# Patient Record
Sex: Female | Born: 1960
Health system: Southern US, Community
[De-identification: ages and names within clinical notes are randomized; demographics above are authoritative.]

## PROBLEM LIST (undated history)

## (undated) DIAGNOSIS — E079 Disorder of thyroid, unspecified: Secondary | ICD-10-CM

## (undated) DIAGNOSIS — K219 Gastro-esophageal reflux disease without esophagitis: Secondary | ICD-10-CM

## (undated) DIAGNOSIS — D649 Anemia, unspecified: Secondary | ICD-10-CM

## (undated) HISTORY — DX: Gastro-esophageal reflux disease without esophagitis: K21.9

## (undated) HISTORY — PX: CHOLECYSTECTOMY: SHX55

## (undated) HISTORY — DX: Disorder of thyroid, unspecified: E07.9

## (undated) HISTORY — DX: Anemia, unspecified: D64.9

---

## 1988-02-27 DIAGNOSIS — I341 Nonrheumatic mitral (valve) prolapse: Secondary | ICD-10-CM

## 1988-02-27 HISTORY — DX: Nonrheumatic mitral (valve) prolapse: I34.1

## 2001-05-05 ENCOUNTER — Encounter: Payer: Self-pay | Admitting: Family Medicine

## 2001-05-07 ENCOUNTER — Encounter: Payer: Self-pay | Admitting: Family Medicine

## 2003-10-27 ENCOUNTER — Encounter: Payer: Self-pay | Admitting: Family Medicine

## 2005-05-09 ENCOUNTER — Encounter: Payer: Self-pay | Admitting: Family Medicine

## 2006-02-13 ENCOUNTER — Encounter: Payer: Self-pay | Admitting: Family Medicine

## 2007-09-23 ENCOUNTER — Encounter: Payer: Self-pay | Admitting: Family Medicine

## 2007-11-13 ENCOUNTER — Encounter: Payer: Self-pay | Admitting: Family Medicine

## 2008-03-05 ENCOUNTER — Encounter: Payer: Self-pay | Admitting: Family Medicine

## 2008-05-07 ENCOUNTER — Encounter: Payer: Self-pay | Admitting: Family Medicine

## 2008-12-10 ENCOUNTER — Encounter: Payer: Self-pay | Admitting: Family Medicine

## 2009-04-22 ENCOUNTER — Encounter: Payer: Self-pay | Admitting: Family Medicine

## 2017-05-31 ENCOUNTER — Encounter: Payer: Self-pay | Admitting: Family Medicine

## 2018-09-05 ENCOUNTER — Encounter: Payer: Self-pay | Admitting: Family Medicine

## 2018-09-05 DIAGNOSIS — Z131 Encounter for screening for diabetes mellitus: Secondary | ICD-10-CM | POA: Diagnosis not present

## 2018-09-05 DIAGNOSIS — E7800 Pure hypercholesterolemia, unspecified: Secondary | ICD-10-CM | POA: Diagnosis not present

## 2018-09-05 DIAGNOSIS — E039 Hypothyroidism, unspecified: Secondary | ICD-10-CM | POA: Diagnosis not present

## 2018-09-12 DIAGNOSIS — Z6830 Body mass index (BMI) 30.0-30.9, adult: Secondary | ICD-10-CM | POA: Diagnosis not present

## 2018-09-12 DIAGNOSIS — R002 Palpitations: Secondary | ICD-10-CM | POA: Diagnosis not present

## 2018-09-12 DIAGNOSIS — R05 Cough: Secondary | ICD-10-CM | POA: Diagnosis not present

## 2018-09-12 DIAGNOSIS — R0989 Other specified symptoms and signs involving the circulatory and respiratory systems: Secondary | ICD-10-CM | POA: Diagnosis not present

## 2018-09-12 DIAGNOSIS — J45909 Unspecified asthma, uncomplicated: Secondary | ICD-10-CM | POA: Diagnosis not present

## 2018-09-12 DIAGNOSIS — Z0000 Encounter for general adult medical examination without abnormal findings: Secondary | ICD-10-CM | POA: Diagnosis not present

## 2018-09-12 DIAGNOSIS — E039 Hypothyroidism, unspecified: Secondary | ICD-10-CM | POA: Diagnosis not present

## 2018-09-12 DIAGNOSIS — R0602 Shortness of breath: Secondary | ICD-10-CM | POA: Diagnosis not present

## 2018-10-02 DIAGNOSIS — J301 Allergic rhinitis due to pollen: Secondary | ICD-10-CM | POA: Diagnosis not present

## 2018-10-28 ENCOUNTER — Encounter: Payer: Self-pay | Admitting: Family Medicine

## 2018-10-28 DIAGNOSIS — R0989 Other specified symptoms and signs involving the circulatory and respiratory systems: Secondary | ICD-10-CM | POA: Diagnosis not present

## 2018-10-28 DIAGNOSIS — J4520 Mild intermittent asthma, uncomplicated: Secondary | ICD-10-CM | POA: Diagnosis not present

## 2018-10-28 DIAGNOSIS — R05 Cough: Secondary | ICD-10-CM | POA: Diagnosis not present

## 2018-10-28 DIAGNOSIS — R911 Solitary pulmonary nodule: Secondary | ICD-10-CM | POA: Diagnosis not present

## 2018-10-31 DIAGNOSIS — R05 Cough: Secondary | ICD-10-CM | POA: Diagnosis not present

## 2018-10-31 DIAGNOSIS — R0989 Other specified symptoms and signs involving the circulatory and respiratory systems: Secondary | ICD-10-CM | POA: Diagnosis not present

## 2018-10-31 DIAGNOSIS — R911 Solitary pulmonary nodule: Secondary | ICD-10-CM | POA: Diagnosis not present

## 2018-12-03 DIAGNOSIS — R918 Other nonspecific abnormal finding of lung field: Secondary | ICD-10-CM | POA: Diagnosis not present

## 2018-12-03 DIAGNOSIS — R05 Cough: Secondary | ICD-10-CM | POA: Diagnosis not present

## 2018-12-19 DIAGNOSIS — Z20828 Contact with and (suspected) exposure to other viral communicable diseases: Secondary | ICD-10-CM | POA: Diagnosis not present

## 2019-01-06 DIAGNOSIS — Z1231 Encounter for screening mammogram for malignant neoplasm of breast: Secondary | ICD-10-CM | POA: Diagnosis not present

## 2019-01-06 DIAGNOSIS — Z01419 Encounter for gynecological examination (general) (routine) without abnormal findings: Secondary | ICD-10-CM | POA: Diagnosis not present

## 2019-01-06 DIAGNOSIS — Z1151 Encounter for screening for human papillomavirus (HPV): Secondary | ICD-10-CM | POA: Diagnosis not present

## 2019-01-07 DIAGNOSIS — Z01419 Encounter for gynecological examination (general) (routine) without abnormal findings: Secondary | ICD-10-CM | POA: Diagnosis not present

## 2019-01-26 DIAGNOSIS — A084 Viral intestinal infection, unspecified: Secondary | ICD-10-CM | POA: Diagnosis not present

## 2019-01-26 DIAGNOSIS — K589 Irritable bowel syndrome without diarrhea: Secondary | ICD-10-CM | POA: Diagnosis not present

## 2019-02-14 DIAGNOSIS — Z13820 Encounter for screening for osteoporosis: Secondary | ICD-10-CM | POA: Diagnosis not present

## 2019-02-14 DIAGNOSIS — M810 Age-related osteoporosis without current pathological fracture: Secondary | ICD-10-CM | POA: Diagnosis not present

## 2019-02-14 DIAGNOSIS — M85852 Other specified disorders of bone density and structure, left thigh: Secondary | ICD-10-CM | POA: Diagnosis not present

## 2019-02-19 DIAGNOSIS — R928 Other abnormal and inconclusive findings on diagnostic imaging of breast: Secondary | ICD-10-CM | POA: Diagnosis not present

## 2019-02-19 DIAGNOSIS — R921 Mammographic calcification found on diagnostic imaging of breast: Secondary | ICD-10-CM | POA: Diagnosis not present

## 2019-02-26 DIAGNOSIS — R921 Mammographic calcification found on diagnostic imaging of breast: Secondary | ICD-10-CM | POA: Diagnosis not present

## 2019-03-02 ENCOUNTER — Encounter: Payer: Self-pay | Admitting: Family Medicine

## 2019-03-02 DIAGNOSIS — E7800 Pure hypercholesterolemia, unspecified: Secondary | ICD-10-CM | POA: Diagnosis not present

## 2019-03-02 DIAGNOSIS — E039 Hypothyroidism, unspecified: Secondary | ICD-10-CM | POA: Diagnosis not present

## 2019-03-09 DIAGNOSIS — J301 Allergic rhinitis due to pollen: Secondary | ICD-10-CM | POA: Diagnosis not present

## 2019-03-09 DIAGNOSIS — E039 Hypothyroidism, unspecified: Secondary | ICD-10-CM | POA: Diagnosis not present

## 2019-04-03 DIAGNOSIS — R05 Cough: Secondary | ICD-10-CM | POA: Diagnosis not present

## 2019-04-03 DIAGNOSIS — J0100 Acute maxillary sinusitis, unspecified: Secondary | ICD-10-CM | POA: Diagnosis not present

## 2019-04-03 DIAGNOSIS — J209 Acute bronchitis, unspecified: Secondary | ICD-10-CM | POA: Diagnosis not present

## 2019-04-03 DIAGNOSIS — Z6830 Body mass index (BMI) 30.0-30.9, adult: Secondary | ICD-10-CM | POA: Diagnosis not present

## 2019-04-28 ENCOUNTER — Other Ambulatory Visit: Payer: Self-pay

## 2019-04-29 ENCOUNTER — Other Ambulatory Visit: Payer: Self-pay

## 2019-04-30 ENCOUNTER — Encounter: Payer: Self-pay | Admitting: Family Medicine

## 2019-04-30 ENCOUNTER — Ambulatory Visit (INDEPENDENT_AMBULATORY_CARE_PROVIDER_SITE_OTHER): Payer: BC Managed Care – PPO | Admitting: Family Medicine

## 2019-04-30 ENCOUNTER — Other Ambulatory Visit: Payer: Self-pay

## 2019-04-30 VITALS — BP 115/73 | HR 87 | Temp 98.1°F | Ht 62.0 in | Wt 163.0 lb

## 2019-04-30 DIAGNOSIS — M8580 Other specified disorders of bone density and structure, unspecified site: Secondary | ICD-10-CM

## 2019-04-30 DIAGNOSIS — Z87898 Personal history of other specified conditions: Secondary | ICD-10-CM

## 2019-04-30 DIAGNOSIS — K219 Gastro-esophageal reflux disease without esophagitis: Secondary | ICD-10-CM

## 2019-04-30 DIAGNOSIS — E034 Atrophy of thyroid (acquired): Secondary | ICD-10-CM | POA: Diagnosis not present

## 2019-04-30 DIAGNOSIS — J3089 Other allergic rhinitis: Secondary | ICD-10-CM

## 2019-04-30 DIAGNOSIS — R002 Palpitations: Secondary | ICD-10-CM

## 2019-04-30 DIAGNOSIS — Z7689 Persons encountering health services in other specified circumstances: Secondary | ICD-10-CM

## 2019-04-30 NOTE — Progress Notes (Signed)
Subjective: WU:[id number] care, heart palpitations, hypothyroidism, GERD HPI: Jodi Rios is a 59 y.o. female presenting to clinic today for:  1.  Heart palpitations Patient reports history of heart palpitations.  Symptoms are well controlled with verapamil.  No chest pain, shortness of breath.  2.  Hypothyroidism Patient reports diagnosed in 2020.  No history of radiation or surgery to the neck.  Family history significant for thyroid disorder in her mother.  Patient denies any recent heart palpitations, unplanned weight change, change in voice, difficulty swallowing, change in bowel habits.  3.  GERD Patient reports good control of acid reflux with omeprazole 20 mg daily.  Denies any nausea, vomiting, hematochezia or melena.  4.  Sinus/allergic rhinitis Patient reports allergy mediated asthma.  She has albuterol inhaler on hand if needed.  She does use Claritin daily but feels that her sinuses will be full and clogged sometimes.  She was treated for a sinus infection with antibiotics and steroids last winter.  She is not entirely sure that everything has dropped down from the frontal sinuses and drained out.  She reports varying compliance with Flonase.  She wants me to check this out today to make sure there is no evidence of ongoing infection.  Additionally, she does work in a Estate agent.  She uses appropriate protective gear.  Past Medical History:  Diagnosis Date  . Anemia   . GERD (gastroesophageal reflux disease)   . Mitral valve prolapse 1990  . Thyroid disease    Past Surgical History:  Procedure Laterality Date  . CESAREAN SECTION    . CHOLECYSTECTOMY     Social History   Socioeconomic History  . Marital status: Divorced    Spouse name: Not on file  . Number of children: 2  . Years of education: Not on file  . Highest education level: Not on file  Occupational History  . Occupation: AR15 Midwife: [employer]    Comment: 2nd  shift  Tobacco Use  . Smoking status: Never Smoker  . Smokeless tobacco: Current User  Substance and Sexual Activity  . Alcohol use: Not Currently  . Drug use: Never  . Sexual activity: Not Currently  Other Topics Concern  . Not on file  Social History Narrative   Patient is divorced.  She has 2 sons.  One son resides with her.  She has 3 grandchildren and 2 step grandchildren.   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   Social Determinants of Health   Financial Resource Strain:   . Difficulty of Paying Living Expenses: Not on file  Food Insecurity:   . Worried About Programme researcher, broadcasting/film/video in the Last Year: Not on file  . Ran Out of Food in the Last Year: Not on file  Transportation Needs:   . Lack of Transportation (Medical): Not on file  . Lack of Transportation (Non-Medical): Not on file  Physical Activity:   . Days of Exercise per Week: Not on file  . Minutes of Exercise per Session: Not on file  Stress:   . Feeling of Stress : Not on file  Social Connections:   . Frequency of Communication with Friends and Family: Not on file  . Frequency of Social Gatherings with Friends and Family: Not on file  . Attends Religious Services: Not on file  . Active Member of Clubs or Organizations: Not on file  . Attends Banker Meetings: Not  on file  . Marital Status: Not on file  Intimate Partner Violence:   . Fear of Current or Ex-Partner: Not on file  . Emotionally Abused: Not on file  . Physically Abused: Not on file  . Sexually Abused: Not on file   Current Meds  Medication Sig  . albuterol (VENTOLIN HFA) 108 (90 Base) MCG/ACT inhaler Inhale into the lungs every 6 (six) hours as needed for wheezing or shortness of breath.  . Cholecalciferol (VITAMIN D3) 10 MCG (400 UNIT) CHEW Chew by mouth.  . fluticasone (FLONASE) 50 MCG/ACT nasal spray Place into both nostrils daily.  Marland Kitchen levothyroxine (SYNTHROID) 25 MCG tablet Take 25 mcg by mouth daily.  Marland Kitchen  loratadine (CLARITIN) 10 MG tablet Take by mouth.  . Multiple Vitamin (MULTI-VITAMIN) tablet Take by mouth.  Marland Kitchen omeprazole (PRILOSEC) 20 MG capsule Take 20 mg by mouth daily.  . verapamil (CALAN) 80 MG tablet Take 1 tablet (80 mg total) by mouth daily. (please contact the office is this rx differs from her previous. She was unsure the dose)  . [DISCONTINUED] verapamil (CALAN) 80 MG tablet    Family History  Problem Relation Age of Onset  . Stroke Mother   . Thyroid disease Mother   . Hyperlipidemia Father   . Diabetes Son   . Cervical cancer Sister   . Multiple sclerosis Sister    Allergies  Allergen Reactions  . Codeine Other (See Comments)  . Nitrofurantoin Other (See Comments)  . Ofloxacin Other (See Comments)  . Penicillins Other (See Comments)  . Sulfamethoxazole Other (See Comments)     Health Maintenance: Pap, mammo, DEXA done w/ OB, ROI completed. She does have history of abnormal mammo but biopsy was negative.  Was told bone density was low and needed medication.  ROS: Per HPI  Objective: Office vital signs reviewed. BP 115/73   Pulse 87   Temp 98.1 F (36.7 C) (Temporal)   Ht 5\' 2"  (1.575 m)   Wt 163 lb (73.9 kg)   SpO2 98%   BMI 29.81 kg/m   Physical Examination:  General: Awake, alert, well nourished, No acute distress HEENT: Normal; sclera white.  No palpable thyroid nodules.  No goiter.  No exophthalmos; she has slight deviated septum.  No gross drainage appreciated on exam.  She has slight erythema and edema of the nasal turbinates. Cardio: regular rate and rhythm, S1S2 heard, no murmurs appreciated Pulm: clear to auscultation bilaterally, no wheezes, rhonchi or rales; normal work of breathing on room air Extremities: warm, well perfused, No edema, cyanosis or clubbing; +2 pulses bilaterally MSK: normal gait and station Skin: dry; intact; no rashes or lesions; normal temperature Neuro: No tremor Psych: Mood stable, speech normal, affect appropriate,  pleasant and interactive  Depression screen [facility] 2/9 04/30/2019  Decreased Interest 0  Down, Depressed, Hopeless 0  PHQ - 2 Score 0  Altered sleeping 0  Tired, decreased energy 0  Change in appetite 0  Feeling bad or failure about yourself  0  Trouble concentrating 0  Moving slowly or fidgety/restless 0  Suicidal thoughts 0  PHQ-9 Score 0   Assessment/ Plan: 59 y.o. female   1. Hypothyroidism due to acquired atrophy of thyroid Asymptomatic.  Plan for thyroid panel in the next few months.  Will await records from previous PCP  2. Heart palpitations Asymptomatic and well controlled with verapamil; renewal sent  3. Gastroesophageal reflux disease without esophagitis Controlled with omeprazole  4. Non-seasonal allergic rhinitis, unspecified trigger Continue Claritin.  Recommended  regular use of Flonase.  No evidence of infection on today's exam  5. Low bone mass Uncertain if this is osteopenia versus osteoporosis.  Awaiting records from OB/GYN  6. Establishing care with new doctor, encounter for  7. History of abnormal mammogram Biopsy of left breast was negative.   Janora Norlander, DO [place] [phone]

## 2019-04-30 NOTE — Patient Instructions (Signed)

## 2019-05-01 ENCOUNTER — Encounter: Payer: Self-pay | Admitting: Family Medicine

## 2019-05-01 MED ORDER — VERAPAMIL HCL 80 MG PO TABS: 80.0000 mg | ORAL_TABLET | Freq: Every day | ORAL | 3 refills | Status: DC

## 2019-06-02 ENCOUNTER — Telehealth: Payer: Self-pay | Admitting: Family Medicine

## 2019-06-02 NOTE — Telephone Encounter (Signed)
  Prescription Request  06/02/2019  What is the name of the medication or equipment? Omeprazole 20 mg #90  Have you contacted your pharmacy to request a refill? (if applicable) NO  Which pharmacy would you like this sent to[facility]   Patient notified that their request is being sent to the clinical staff for review and that they should receive a response within 2 business days.

## 2019-06-03 ENCOUNTER — Other Ambulatory Visit: Payer: Self-pay | Admitting: Family Medicine

## 2019-06-03 MED ORDER — OMEPRAZOLE 20 MG PO CPDR: 20.0000 mg | DELAYED_RELEASE_CAPSULE | Freq: Every day | ORAL | 3 refills | Status: DC

## 2019-06-05 DIAGNOSIS — R05 Cough: Secondary | ICD-10-CM | POA: Diagnosis not present

## 2019-06-05 DIAGNOSIS — Z6829 Body mass index (BMI) 29.0-29.9, adult: Secondary | ICD-10-CM | POA: Diagnosis not present

## 2019-07-09 ENCOUNTER — Telehealth (INDEPENDENT_AMBULATORY_CARE_PROVIDER_SITE_OTHER): Payer: BC Managed Care – PPO | Admitting: Family

## 2019-07-09 ENCOUNTER — Encounter: Payer: Self-pay | Admitting: Family

## 2019-07-09 DIAGNOSIS — J4541 Moderate persistent asthma with (acute) exacerbation: Secondary | ICD-10-CM

## 2019-07-09 MED ORDER — DOXYCYCLINE HYCLATE 100 MG PO TABS: 100.0000 mg | ORAL_TABLET | Freq: Two times a day (BID) | ORAL | 0 refills | Status: DC

## 2019-07-09 MED ORDER — ALBUTEROL SULFATE HFA 108 (90 BASE) MCG/ACT IN AERS: 1.0000 | INHALATION_SPRAY | Freq: Four times a day (QID) | RESPIRATORY_TRACT | 3 refills | Status: DC | PRN

## 2019-07-09 MED ORDER — CETIRIZINE HCL 10 MG PO TABS: 10.0000 mg | ORAL_TABLET | Freq: Every day | ORAL | 4 refills | Status: DC

## 2019-07-09 MED ORDER — FLUTICASONE PROPIONATE 50 MCG/ACT NA SUSP: 2.0000 | Freq: Every day | NASAL | 6 refills | Status: DC

## 2019-07-09 MED ORDER — MONTELUKAST SODIUM 10 MG PO TABS: 10.0000 mg | ORAL_TABLET | Freq: Every day | ORAL | 3 refills | Status: DC

## 2019-07-09 NOTE — Progress Notes (Signed)
Virtual Visit via telephone Note Due to COVID-19 pandemic this visit was conducted virtually. This visit type was conducted due to national recommendations for restrictions regarding the COVID-19 Pandemic (e.g. social distancing, sheltering in place) in an effort to limit this patient's exposure and mitigate transmission in our community. All issues noted in this document were discussed and addressed.  A physical exam was not performed with this format.  I connected with Jodi Rios on 07/09/19 at 3:30 pm by telephone and video and verified that I am speaking with the correct person using two identifiers. Jodi Rios is currently located at car and no one is currently with her during visit. The provider, Jannifer Rodney, FNP is located in their office at time of visit.  I discussed the limitations, risks, security and privacy concerns of performing an evaluation and management service by telephone and the availability of in person appointments. I also discussed with the patient that there may be a patient responsible charge related to this service. The patient expressed understanding and agreed to proceed.   History and Present Illness:  Asthma She complains of cough, frequent throat clearing, shortness of breath, sputum production and wheezing. There is no difficulty breathing. This is a chronic problem. The current episode started more than 1 year ago. The problem occurs intermittently. The problem has been waxing and waning. The cough is productive of sputum. Associated symptoms include ear pain, headaches, malaise/fatigue, nasal congestion, postnasal drip, rhinorrhea and sneezing. Pertinent negatives include no ear congestion, fever or sore throat. Her symptoms are aggravated by pollen. Her symptoms are alleviated by rest and oral steroids. She reports minimal improvement on treatment. Her past medical history is significant for asthma.      Review of Systems  Constitutional: Positive  for malaise/fatigue. Negative for fever.  HENT: Positive for ear pain, postnasal drip, rhinorrhea and sneezing. Negative for sore throat.   Respiratory: Positive for cough, sputum production, shortness of breath and wheezing.   Neurological: Positive for headaches.     Observations/Objective: No SOB or distress noted, pt looks well  Assessment and Plan: 1. Moderate persistent asthma with acute exacerbation Will change claritin to Zyrtec Avoid allergens Rest Force fluids RTO if symptoms worsen or do not improve  - montelukast (SINGULAIR) 10 MG tablet; Take 1 tablet (10 mg total) by mouth at bedtime.  Dispense: 90 tablet; Refill: 3 - cetirizine (ZYRTEC) 10 MG tablet; Take 1 tablet (10 mg total) by mouth daily.  Dispense: 90 tablet; Refill: 4 - doxycycline (VIBRA-TABS) 100 MG tablet; Take 1 tablet (100 mg total) by mouth 2 (two) times daily.  Dispense: 20 tablet; Refill: 0 - fluticasone (FLONASE) 50 MCG/ACT nasal spray; Place 2 sprays into both nostrils daily.  Dispense: 11.1 mL; Refill: 6 - albuterol (VENTOLIN HFA) 108 (90 Base) MCG/ACT inhaler; Inhale 1-2 puffs into the lungs every 6 (six) hours as needed for wheezing or shortness of breath.  Dispense: 8 g; Refill: 3     I discussed the assessment and treatment plan with the patient. The patient was provided an opportunity to ask questions and all were answered. The patient agreed with the plan and demonstrated an understanding of the instructions.   The patient was advised to call back or seek an in-person evaluation if the symptoms worsen or if the condition fails to improve as anticipated.  The above assessment and management plan was discussed with the patient. The patient verbalized understanding of and has agreed to the management plan. Patient is  aware to call the clinic if symptoms persist or worsen. Patient is aware when to return to the clinic for a follow-up visit. Patient educated on when it is appropriate to go to the  emergency department.   Time call ended:  3:44 pm   I provided 14 minutes of non and face-to-face time during this encounter.    Evelina Dun, FNP

## 2019-07-10 ENCOUNTER — Telehealth: Payer: Self-pay | Admitting: Family

## 2019-07-13 MED ORDER — PREDNISONE 10 MG (21) PO TBPK
ORAL_TABLET | ORAL | 0 refills | Status: DC
Start: 2019-07-13 — End: 2019-08-17

## 2019-07-13 NOTE — Telephone Encounter (Signed)
Patient aware, per voicemail left on home voice mail,  scripts are  ready.

## 2019-07-13 NOTE — Telephone Encounter (Signed)
Not sure why it was sent to me

## 2019-07-13 NOTE — Telephone Encounter (Signed)
Done

## 2019-07-20 ENCOUNTER — Telehealth (INDEPENDENT_AMBULATORY_CARE_PROVIDER_SITE_OTHER): Payer: BC Managed Care – PPO | Admitting: Nurse Practitioner

## 2019-07-20 DIAGNOSIS — J4521 Mild intermittent asthma with (acute) exacerbation: Secondary | ICD-10-CM

## 2019-07-20 MED ORDER — DOXYCYCLINE HYCLATE 100 MG PO TABS: 100.0000 mg | ORAL_TABLET | Freq: Two times a day (BID) | ORAL | 0 refills | Status: DC

## 2019-07-20 NOTE — Progress Notes (Signed)
Virtual Visit via telephone Note Due to COVID-19 pandemic this visit was conducted virtually. This visit type was conducted due to national recommendations for restrictions regarding the COVID-19 Pandemic (e.g. social distancing, sheltering in place) in an effort to limit this patient's exposure and mitigate transmission in our community. All issues noted in this document were discussed and addressed.  A physical exam was not performed with this format.  I connected with Jodi Rios on 07/20/19 at 2:00 by telephone and verified that I am speaking with the correct person using two identifiers. Jodi Rios is currently located at [facility] and no one is currently with  him during visit. The provider, Mary-Margaret Hassell Done, FNP is located in their office at time of visit.  I discussed the limitations, risks, security and privacy concerns of performing an evaluation and management service by telephone and the availability of in person appointments. I also discussed with the patient that there may be a patient responsible charge related to this service. The patient expressed understanding and agreed to proceed.   History and Present Illness:   Chief Complaint: Asthma   HPI Patient call in  Last week and was given antibiotic for sinus infection and asthma. She has been using her albutrol HFA and nebulizer. She say he usually needs antibiotic two rounds to clear it up. Did not use albuterol prior to getting sick. She ay her cough is till productive   Review of Systems  Constitutional: Negative for chills and fever.  HENT: Positive for congestion. Negative for sinus pain and sore throat.   Respiratory: Positive for cough.   Neurological: Negative for dizziness and headaches.  All other systems reviewed and are negative.    Observations/Objective: Alert and oriented- answers all questions appropriately No distress Dry tight cough   Assessment and Plan: Jodi Rios in today with chief  complaint of Asthma   1. Mild intermittent asthma with exacerbation 1. Take meds as prescribed 2. Use a cool mist humidifier especially during the winter months and when heat has been humid. 3. Use saline nose sprays frequently 4. Saline irrigations of the nose can be very helpful if done frequently.  * 4X daily for 1 week*  * Use of a nettie pot can be helpful with this. Follow directions with this* 5. Drink plenty of fluids 6. Keep thermostat turn down low 7.For any cough or congestion  Use plain Mucinex- regular strength or max strength is fine   * Children- consult with Pharmacist for dosing 8. For fever or aces or pains- take tylenol or ibuprofen appropriate for age and weight.  * for fevers greater than 101 orally you may alternate ibuprofen and tylenol every  3 hours.    - doxycycline (VIBRA-TABS) 100 MG tablet; Take 1 tablet (100 mg total) by mouth 2 (two) times daily. 1 po bid  Dispense: 14 tablet; Refill: 0  * patient insisted on another round of antibiotic. He say she kniow her body and she needs it. I told her I really thougt Amelia Jo would run it course and go away on it own. She did not agree with me.    Follow Up Instructions: prn    I discussed the assessment and treatment plan with the patient. The patient was provided an opportunity to ask questions and all were answered. The patient agreed with the plan and demonstrated an understanding of the instructions.   The patient was advised to call back or seek an in-person evaluation if the symptoms  worsen or if the condition fails to improve as anticipated.  The above assessment and management plan was discussed with the patient. The patient verbalized understanding of and has agreed to the management plan. Patient is aware to call the clinic if symptoms persist or worsen. Patient is aware when to return to the clinic for a follow-up visit. Patient educated on when it is appropriate to go to the emergency department.   Time  call ended:  2:12  I provided 12 minutes of non-face-to-face time during this encounter.    Mary-Margaret Daphine Deutscher, FNP

## 2019-08-04 ENCOUNTER — Ambulatory Visit: Payer: BC Managed Care – PPO | Admitting: Family Medicine

## 2019-08-17 ENCOUNTER — Other Ambulatory Visit: Payer: Self-pay

## 2019-08-17 ENCOUNTER — Ambulatory Visit (INDEPENDENT_AMBULATORY_CARE_PROVIDER_SITE_OTHER): Payer: BC Managed Care – PPO | Admitting: Family Medicine

## 2019-08-17 ENCOUNTER — Encounter: Payer: Self-pay | Admitting: Family Medicine

## 2019-08-17 VITALS — BP 111/75 | HR 86 | Temp 97.9°F | Ht 62.0 in | Wt 163.0 lb

## 2019-08-17 DIAGNOSIS — Z23 Encounter for immunization: Secondary | ICD-10-CM

## 2019-08-17 DIAGNOSIS — E034 Atrophy of thyroid (acquired): Secondary | ICD-10-CM

## 2019-08-17 DIAGNOSIS — Z13220 Encounter for screening for lipoid disorders: Secondary | ICD-10-CM | POA: Diagnosis not present

## 2019-08-17 DIAGNOSIS — Z130 Encounter for screening for diseases of the blood and blood-forming organs and certain disorders involving the immune mechanism: Secondary | ICD-10-CM | POA: Diagnosis not present

## 2019-08-17 DIAGNOSIS — M8580 Other specified disorders of bone density and structure, unspecified site: Secondary | ICD-10-CM

## 2019-08-17 DIAGNOSIS — R5383 Other fatigue: Secondary | ICD-10-CM

## 2019-08-17 NOTE — Progress Notes (Signed)
Subjective: CC: hypothyroidism PCP: Janora Norlander, DO SMO:LMBEM Jodi Rios is a 58 y.o. female presenting to clinic today for:  1. Hypothyroidism, acquired Patient reports compliance with Synthroid 25 mcg daily.  She does not report any change in voice, heart palpitations, tremor.  She does report some decreased energy.   ROS: Per HPI  Allergies  Allergen Reactions  . Codeine Other (See Comments)  . Nitrofurantoin Other (See Comments)  . Ofloxacin Other (See Comments)  . Penicillins Other (See Comments)  . Sulfamethoxazole Other (See Comments)   Past Medical History:  Diagnosis Date  . Anemia   . GERD (gastroesophageal reflux disease)   . Mitral valve prolapse 1990  . Thyroid disease     Current Outpatient Medications:  .  albuterol (VENTOLIN HFA) 108 (90 Base) MCG/ACT inhaler, Inhale 1-2 puffs into the lungs every 6 (six) hours as needed for wheezing or shortness of breath., Disp: 8 g, Rfl: 3 .  cetirizine (ZYRTEC) 10 MG tablet, Take 1 tablet (10 mg total) by mouth daily., Disp: 90 tablet, Rfl: 4 .  Cholecalciferol (VITAMIN D3) 10 MCG (400 UNIT) CHEW, Chew by mouth., Disp: , Rfl:  .  fluticasone (FLONASE) 50 MCG/ACT nasal spray, Place 2 sprays into both nostrils daily., Disp: 11.1 mL, Rfl: 6 .  levothyroxine (SYNTHROID) 25 MCG tablet, Take 25 mcg by mouth daily., Disp: , Rfl:  .  montelukast (SINGULAIR) 10 MG tablet, Take 1 tablet (10 mg total) by mouth at bedtime., Disp: 90 tablet, Rfl: 3 .  Multiple Vitamin (MULTI-VITAMIN) tablet, Take by mouth., Disp: , Rfl:  .  omeprazole (PRILOSEC) 20 MG capsule, Take 1 capsule (20 mg total) by mouth daily., Disp: 90 capsule, Rfl: 3 .  verapamil (CALAN) 80 MG tablet, Take 1 tablet (80 mg total) by mouth daily. (please contact the office is this rx differs from her previous. She was unsure the dose), Disp: 90 tablet, Rfl: 3 Social History   Socioeconomic History  . Marital status: Divorced    Spouse name: Not on file  . Number  of children: 2  . Years of education: Not on file  . Highest education level: Not on file  Occupational History  . 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 Smoking status: Never Smoker  . Smokeless tobacco: Current User  Vaping Use  . Vaping Use: Never used  Substance and Sexual Activity  . Alcohol use: Not Currently  . Drug use: Never  . Sexual activity: Not Currently  Other Topics Concern  . Not on file  Social History Narrative   Patient is divorced.  She has 2 sons.  One son resides with her.  She has 3 grandchildren and 2 step grandchildren.   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   Social Determinants of Health   Financial Resource Strain:   . Difficulty of Paying Living Expenses:   Food Insecurity:   . Worried About Charity fundraiser in the Last Year:   . Arboriculturist in the Last Year:   Transportation Needs:   . Film/video editor (Medical):   Marland Kitchen Lack of Transportation (Non-Medical):   Physical Activity:   . Days of Exercise per Week:   . Minutes of Exercise per Session:   Stress:   . Feeling of Stress :   Social Connections:   . Frequency of Communication with Friends and  Family:   . Frequency of Social Gatherings with Friends and Family:   . Attends Religious Services:   . Active Member of Clubs or Organizations:   . Attends Archivist Meetings:   Marland Kitchen Marital Status:   Intimate Partner Violence:   . Fear of Current or Ex-Partner:   . Emotionally Abused:   Marland Kitchen Physically Abused:   . Sexually Abused:    Family History  Problem Relation Age of Onset  . Stroke Mother   . Thyroid disease Mother   . Hyperlipidemia Father   . Diabetes Son   . Cervical cancer Sister   . Multiple sclerosis Sister     Objective: Office vital signs reviewed. BP 111/75   Pulse 86   Temp 97.9 F (36.6 C) (Temporal)   Ht _0  (1.575 m)   Wt 163 lb (73.9 kg)   SpO2 99%   BMI  29.81 kg/m   Physical Examination:  General: Awake, alert, well nourished, No acute distress HEENT: Normal; no exophthalmos.  Sclera white.  Slightly full feeling thyroid but no palpable nodules.  No goiter Cardio: regular rate and rhythm, S1S2 heard, no murmurs appreciated Pulm: clear to auscultation bilaterally, no wheezes, rhonchi or rales; normal work of breathing on room air Extremities: warm, well perfused, No edema, cyanosis or clubbing; +2 pulses bilaterally MSK: normal gait and station Skin: dry; intact; no rashes or lesions; normal temperature Neuro: No tremor  Assessment/ Plan: 59 y.o. female   1. Hypothyroidism due to acquired atrophy of thyroid Some low energy.  She will come in for thyroid panel along with her fasting labs within the next few weeks.  She will see me back the end of July for a full physical exam.  She is established with OB/GYN who will do her pelvic and Pap smear. - Thyroid Panel With TSH; Future - CMP14+EGFR; Future  2. Low bone mass - VITAMIN D 25 Hydroxy (Vit-D Deficiency, Fractures); Future - CMP14+EGFR; Future  3. Screening cholesterol level - Lipid panel; Future  4. Screening, anemia, deficiency, iron - CBC; Future  5. Fatigue, unspecified type - Thyroid Panel With TSH; Future - CBC; Future - Vitamin B12; Future  Plan for EKG along with her physical exam given history of heart palpitations requiring Cardizem  Orders Placed This Encounter  Procedures  . Tdap vaccine greater than or equal to 7yo IM  . Thyroid Panel With TSH    Standing Status:   Future    Standing Expiration Date:   08/16/2020  . Lipid panel    Standing Status:   Future    Standing Expiration Date:   08/16/2020  . CBC    Standing Status:   Future    Standing Expiration Date:   08/16/2020  . VITAMIN D 25 Hydroxy (Vit-D Deficiency, Fractures)    Standing Status:   Future    Standing Expiration Date:   08/16/2020  . CMP14+EGFR    Standing Status:   Future    Standing  Expiration Date:   08/16/2020  . Vitamin B12    Standing Status:   Future    Standing Expiration Date:   08/16/2020   No orders of the defined types were placed in this encounter.    Janora Norlander, DO [place] [phone]

## 2019-08-27 ENCOUNTER — Ambulatory Visit (INDEPENDENT_AMBULATORY_CARE_PROVIDER_SITE_OTHER): Payer: BC Managed Care – PPO | Admitting: Family Medicine

## 2019-08-27 ENCOUNTER — Encounter: Payer: Self-pay | Admitting: Family Medicine

## 2019-08-27 DIAGNOSIS — J019 Acute sinusitis, unspecified: Secondary | ICD-10-CM | POA: Diagnosis not present

## 2019-08-27 MED ORDER — AZITHROMYCIN 250 MG PO TABS: ORAL_TABLET | ORAL | 0 refills | Status: DC

## 2019-08-27 MED ORDER — METHYLPREDNISOLONE 4 MG PO TBPK: ORAL_TABLET | ORAL | 0 refills | Status: DC

## 2019-08-27 NOTE — Progress Notes (Signed)
Virtual Visit via Telephone Note  I connected with Bonny Vanleeuwen Kinkead on 08/27/19 at 1:17 PM by telephone and verified that I am speaking with the correct person using two identifiers. Haley Fuerstenberg Melnik is currently located at the beauty salon and nobody is currently with her during this visit. The provider, Gwenlyn Fudge, FNP is located in their office at time of visit.  I discussed the limitations, risks, security and privacy concerns of performing an evaluation and management service by telephone and the availability of in person appointments. I also discussed with the patient that there may be a patient responsible charge related to this service. The patient expressed understanding and agreed to proceed.  Subjective: PCP: Raliegh Ip, DO  Chief Complaint  Patient presents with  . URI   Patient complains of cough, head congestion, ear pain/pressure, postnasal drainage and chest tightness. Onset of symptoms was 2 days ago, rapidly worsening since that time. She is drinking plenty of fluids. Evaluation to date: none. Treatment to date: antihistamines and nasal steroids. She has a history of asthma. She does not smoke.    ROS: Per HPI  Current Outpatient Medications:  .  albuterol (VENTOLIN HFA) 108 (90 Base) MCG/ACT inhaler, Inhale 1-2 puffs into the lungs every 6 (six) hours as needed for wheezing or shortness of breath., Disp: 8 g, Rfl: 3 .  cetirizine (ZYRTEC) 10 MG tablet, Take 1 tablet (10 mg total) by mouth daily., Disp: 90 tablet, Rfl: 4 .  Cholecalciferol (VITAMIN D3) 10 MCG (400 UNIT) CHEW, Chew by mouth., Disp: , Rfl:  .  fluticasone (FLONASE) 50 MCG/ACT nasal spray, Place 2 sprays into both nostrils daily., Disp: 11.1 mL, Rfl: 6 .  levothyroxine (SYNTHROID) 25 MCG tablet, Take 25 mcg by mouth daily., Disp: , Rfl:  .  montelukast (SINGULAIR) 10 MG tablet, Take 1 tablet (10 mg total) by mouth at bedtime., Disp: 90 tablet, Rfl: 3 .  Multiple Vitamin (MULTI-VITAMIN) tablet,  Take by mouth., Disp: , Rfl:  .  omeprazole (PRILOSEC) 20 MG capsule, Take 1 capsule (20 mg total) by mouth daily., Disp: 90 capsule, Rfl: 3 .  verapamil (CALAN) 80 MG tablet, Take 1 tablet (80 mg total) by mouth daily. (please contact the office is this rx differs from her previous. She was unsure the dose), Disp: 90 tablet, Rfl: 3  Allergies  Allergen Reactions  . Codeine Other (See Comments)  . Nitrofurantoin Other (See Comments)  . Ofloxacin Other (See Comments)  . Penicillins Other (See Comments)  . Sulfamethoxazole Other (See Comments)   Past Medical History:  Diagnosis Date  . Anemia   . GERD (gastroesophageal reflux disease)   . Mitral valve prolapse 1990  . Thyroid disease     Observations/Objective: A&O  No respiratory distress or wheezing audible over the phone Mood, judgement, and thought processes all WNL  Assessment and Plan: 1. Acute non-recurrent sinusitis, unspecified location - Discussed symptom management.  - azithromycin (ZITHROMAX Z-PAK) 250 MG tablet; Take 2 tablets (500 mg) PO today, then 1 tablet (250 mg) PO daily x4 days.  Dispense: 6 tablet; Refill: 0 - methylPREDNISolone (MEDROL DOSEPAK) 4 MG TBPK tablet; Use as directed on package.  Dispense: 21 each; Refill: 0  Follow Up Instructions:  I discussed the assessment and treatment plan with the patient. The patient was provided an opportunity to ask questions and all were answered. The patient agreed with the plan and demonstrated an understanding of the instructions.   The patient was advised  to call back or seek an in-person evaluation if the symptoms worsen or if the condition fails to improve as anticipated.  The above assessment and management plan was discussed with the patient. The patient verbalized understanding of and has agreed to the management plan. Patient is aware to call the clinic if symptoms persist or worsen. Patient is aware when to return to the clinic for a follow-up visit. Patient  educated on when it is appropriate to go to the emergency department.   Time call ended: 1:27 PM  I provided 12 minutes of non-face-to-face time during this encounter.  Deliah Boston, MSN, APRN, FNP-C [facility] 08/27/19

## 2019-09-18 ENCOUNTER — Other Ambulatory Visit: Payer: Self-pay

## 2019-09-18 ENCOUNTER — Other Ambulatory Visit: Payer: BC Managed Care – PPO

## 2019-09-18 DIAGNOSIS — E034 Atrophy of thyroid (acquired): Secondary | ICD-10-CM | POA: Diagnosis not present

## 2019-09-18 DIAGNOSIS — M8580 Other specified disorders of bone density and structure, unspecified site: Secondary | ICD-10-CM

## 2019-09-18 DIAGNOSIS — R5383 Other fatigue: Secondary | ICD-10-CM

## 2019-09-18 DIAGNOSIS — Z130 Encounter for screening for diseases of the blood and blood-forming organs and certain disorders involving the immune mechanism: Secondary | ICD-10-CM

## 2019-09-18 DIAGNOSIS — Z13220 Encounter for screening for lipoid disorders: Secondary | ICD-10-CM

## 2019-09-19 LAB — CBC
Hematocrit: 43.6 % (ref 34.0–46.6)
Hemoglobin: 14.3 g/dL (ref 11.1–15.9)
MCH: 29.3 pg (ref 26.6–33.0)
MCHC: 32.8 g/dL (ref 31.5–35.7)
MCV: 89 fL (ref 79–97)
Platelets: 277 10*3/uL (ref 150–450)
RBC: 4.88 x10E6/uL (ref 3.77–5.28)
RDW: 12.9 % (ref 11.7–15.4)
WBC: 6.6 10*3/uL (ref 3.4–10.8)

## 2019-09-19 LAB — CMP14+EGFR
ALT: 29 IU/L (ref 0–32)
AST: 21 IU/L (ref 0–40)
Albumin/Globulin Ratio: 2 (ref 1.2–2.2)
Albumin: 4 g/dL (ref 3.8–4.9)
Alkaline Phosphatase: 130 IU/L — ABNORMAL HIGH (ref 48–121)
BUN/Creatinine Ratio: 20 (ref 9–23)
BUN: 14 mg/dL (ref 6–24)
Bilirubin Total: 0.5 mg/dL (ref 0.0–1.2)
CO2: 24 mmol/L (ref 20–29)
Calcium: 8.9 mg/dL (ref 8.7–10.2)
Chloride: 102 mmol/L (ref 96–106)
Creatinine, Ser: 0.7 mg/dL (ref 0.57–1.00)
GFR calc Af Amer: 110 mL/min/{1.73_m2} (ref >59)
GFR calc non Af Amer: 95 mL/min/{1.73_m2} (ref >59)
Globulin, Total: 2 g/dL (ref 1.5–4.5)
Glucose: 87 mg/dL (ref 65–99)
Potassium: 3.8 mmol/L (ref 3.5–5.2)
Sodium: 142 mmol/L (ref 134–144)
Total Protein: 6 g/dL (ref 6.0–8.5)

## 2019-09-19 LAB — THYROID PANEL WITH TSH
Free Thyroxine Index: 2.6 (ref 1.2–4.9)
T3 Uptake Ratio: 25 % (ref 24–39)
T4, Total: 10.2 ug/dL (ref 4.5–12.0)
TSH: 3.59 u[IU]/mL (ref 0.450–4.500)

## 2019-09-19 LAB — LIPID PANEL
Chol/HDL Ratio: 2.5 ratio (ref 0.0–4.4)
Cholesterol, Total: 153 mg/dL (ref 100–199)
HDL: 61 mg/dL (ref >39)
LDL Chol Calc (NIH): 78 mg/dL (ref 0–99)
Triglycerides: 74 mg/dL (ref 0–149)
VLDL Cholesterol Cal: 14 mg/dL (ref 5–40)

## 2019-09-19 LAB — VITAMIN B12: Vitamin B-12: 536 pg/mL (ref 232–1245)

## 2019-09-19 LAB — VITAMIN D 25 HYDROXY (VIT D DEFICIENCY, FRACTURES): Vit D, 25-Hydroxy: 33.8 ng/mL (ref 30.0–100.0)

## 2019-09-25 ENCOUNTER — Other Ambulatory Visit: Payer: Self-pay

## 2019-09-25 ENCOUNTER — Encounter: Payer: Self-pay | Admitting: Family Medicine

## 2019-09-25 ENCOUNTER — Ambulatory Visit (INDEPENDENT_AMBULATORY_CARE_PROVIDER_SITE_OTHER): Payer: BC Managed Care – PPO | Admitting: Family Medicine

## 2019-09-25 VITALS — BP 117/71 | HR 92 | Temp 97.6°F | Ht 62.0 in | Wt 167.6 lb

## 2019-09-25 DIAGNOSIS — J310 Chronic rhinitis: Secondary | ICD-10-CM

## 2019-09-25 DIAGNOSIS — R002 Palpitations: Secondary | ICD-10-CM

## 2019-09-25 DIAGNOSIS — Z0000 Encounter for general adult medical examination without abnormal findings: Secondary | ICD-10-CM

## 2019-09-25 DIAGNOSIS — R0683 Snoring: Secondary | ICD-10-CM

## 2019-09-25 DIAGNOSIS — G4719 Other hypersomnia: Secondary | ICD-10-CM

## 2019-09-25 DIAGNOSIS — Z0001 Encounter for general adult medical examination with abnormal findings: Secondary | ICD-10-CM

## 2019-09-25 DIAGNOSIS — J329 Chronic sinusitis, unspecified: Secondary | ICD-10-CM

## 2019-09-25 MED ORDER — AZITHROMYCIN 250 MG PO TABS: ORAL_TABLET | ORAL | 0 refills | Status: DC

## 2019-09-25 NOTE — Patient Instructions (Signed)
Sleep Apnea Sleep apnea is a condition in which breathing pauses or becomes shallow during sleep. Episodes of sleep apnea usually last 10 seconds or longer, and they may occur as many as 20 times an hour. Sleep apnea disrupts your sleep and keeps your body from getting the rest that it needs. This condition can increase your risk of certain health problems, including:  Heart attack.  Stroke.  Obesity.  Diabetes.  Heart failure.  Irregular heartbeat. What are the causes? There are three kinds of sleep apnea:  Obstructive sleep apnea. This kind is caused by a blocked or collapsed airway.  Central sleep apnea. This kind happens when the part of the brain that controls breathing does not send the correct signals to the muscles that control breathing.  Mixed sleep apnea. This is a combination of obstructive and central sleep apnea. The most common cause of this condition is a collapsed or blocked airway. An airway can collapse or become blocked if:  Your throat muscles are abnormally relaxed.  Your tongue and tonsils are larger than normal.  You are overweight.  Your airway is smaller than normal. What increases the risk? You are more likely to develop this condition if you:  Are overweight.  Smoke.  Have a smaller than normal airway.  Are elderly.  Are female.  Drink alcohol.  Take sedatives or tranquilizers.  Have a family history of sleep apnea. What are the signs or symptoms? Symptoms of this condition include:  Trouble staying asleep.  Daytime sleepiness and tiredness.  Irritability.  Loud snoring.  Morning headaches.  Trouble concentrating.  Forgetfulness.  Decreased interest in sex.  Unexplained sleepiness.  Mood swings.  Personality changes.  Feelings of depression.  Waking up often during the night to urinate.  Dry mouth.  Sore throat. How is this diagnosed? This condition may be diagnosed with:  A medical history.  A physical  exam.  A series of tests that are done while you are sleeping (sleep study). These tests are usually done in a sleep lab, but they may also be done at home. How is this treated? Treatment for this condition aims to restore normal breathing and to ease symptoms during sleep. It may involve managing health issues that can affect breathing, such as high blood pressure or obesity. Treatment may include:  Sleeping on your side.  Using a decongestant if you have nasal congestion.  Avoiding the use of depressants, including alcohol, sedatives, and narcotics.  Losing weight if you are overweight.  Making changes to your diet.  Quitting smoking.  Using a device to open your airway while you sleep, such as: ? An oral appliance. This is a custom-made mouthpiece that shifts your lower jaw forward. ? A continuous positive airway pressure (CPAP) device. This device blows air through a mask when you breathe out (exhale). ? A nasal expiratory positive airway pressure (EPAP) device. This device has valves that you put into each nostril. ? A bi-level positive airway pressure (BPAP) device. This device blows air through a mask when you breathe in (inhale) and breathe out (exhale).  Having surgery if other treatments do not work. During surgery, excess tissue is removed to create a wider airway. It is important to get treatment for sleep apnea. Without treatment, this condition can lead to:  High blood pressure.  Coronary artery disease.  In men, an inability to achieve or maintain an erection (impotence).  Reduced thinking abilities. Follow these instructions at home: Lifestyle  Make any lifestyle changes   that your health care provider recommends.  Eat a healthy, well-balanced diet.  Take steps to lose weight if you are overweight.  Avoid using depressants, including alcohol, sedatives, and narcotics.  Do not use any products that contain nicotine or tobacco, such as cigarettes,  e-cigarettes, and chewing tobacco. If you need help quitting, ask your health care provider. General instructions  Take over-the-counter and prescription medicines only as told by your health care provider.  If you were given a device to open your airway while you sleep, use it only as told by your health care provider.  If you are having surgery, make sure to tell your health care provider you have sleep apnea. You may need to bring your device with you.  Keep all follow-up visits as told by your health care provider. This is important. Contact a health care provider if:  The device that you received to open your airway during sleep is uncomfortable or does not seem to be working.  Your symptoms do not improve.  Your symptoms get worse. Get help right away if:  You develop: ? Chest pain. ? Shortness of breath. ? Discomfort in your back, arms, or stomach.  You have: ? Trouble speaking. ? Weakness on one side of your body. ? Drooping in your face. These symptoms may represent a serious problem that is an emergency. Do not wait to see if the symptoms will go away. Get medical help right away. Call your local emergency services (911 in the [place] Do not drive yourself to the hospital. Summary  Sleep apnea is a condition in which breathing pauses or becomes shallow during sleep.  The most common cause is a collapsed or blocked airway.  The goal of treatment is to restore normal breathing and to ease symptoms during sleep. This information is not intended to replace advice given to you by your health care provider. Make sure you discuss any questions you have with your health care provider. Document Revised: 07/30/2018 Document Reviewed: 10/08/2017 Elsevier Patient Education  2020 Elsevier Inc.  

## 2019-09-25 NOTE — Progress Notes (Signed)
Jodi Rios is a 59 y.o. female presents to office today for annual physical exam examination.    She reports that she thinks she is "getting another infection".  She was recently treated with a Z-Pak for sinusitis.  She believes her symptoms to be recurrent and due to her mask.  She has not gotten Covid vaccinated.  She reports drainage, chest congestion with productive cough with yellow sputum.  No hemoptysis, change in breathing, fevers, chills, purulence from nares.  Chronic fatigue: Reviewed her labs.  No evidence of metabolic disturbance.  Admits to snoring.  Occupation: Works at [employer]  Diet: Fair  Last colonoscopy: UTD Last mammogram: UTD Last pap smear: UTD Refills needed today: None Immunizations needed: COVID-19 vaccination  Past Medical History:  Diagnosis Date  . Anemia   . GERD (gastroesophageal reflux disease)   . Mitral valve prolapse 1990  . Thyroid disease    Social History   Socioeconomic History  . Marital status: Divorced    Spouse name: Not on file  . Number of children: 2  . Years of education: Not on file  . Highest education level: Not on file  Occupational History  . 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 Smoking status: Never Smoker  . Smokeless tobacco: Current User  Vaping Use  . Vaping Use: Never used  Substance and Sexual Activity  . Alcohol use: Not Currently  . Drug use: Never  . Sexual activity: Not Currently  Other Topics Concern  . Not on file  Social History Narrative   Patient is divorced.  She has 2 sons.  One son resides with her.  She has 3 grandchildren and 2 step grandchildren.   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   Social Determinants of Health   Financial Resource Strain:   . Difficulty of Paying Living Expenses:   Food Insecurity:   . Worried About Programme researcher, broadcasting/film/video in the Last Year:   . Barista in the Last Year:     Transportation Needs:   . Freight forwarder (Medical):   Marland Kitchen Lack of Transportation (Non-Medical):   Physical Activity:   . Days of Exercise per Week:   . Minutes of Exercise per Session:   Stress:   . Feeling of Stress :   Social Connections:   . Frequency of Communication with Friends and Family:   . Frequency of Social Gatherings with Friends and Family:   . Attends Religious Services:   . Active Member of Clubs or Organizations:   . Attends Banker Meetings:   Marland Kitchen Marital Status:   Intimate Partner Violence:   . Fear of Current or Ex-Partner:   . Emotionally Abused:   Marland Kitchen Physically Abused:   . Sexually Abused:    Past Surgical History:  Procedure Laterality Date  . CESAREAN SECTION    . CHOLECYSTECTOMY     Family History  Problem Relation Age of Onset  . Stroke Mother   . Thyroid disease Mother   . Hyperlipidemia Father   . Diabetes Son   . Cervical cancer Sister   . Multiple sclerosis Sister     Current Outpatient Medications:  .  albuterol (VENTOLIN HFA) 108 (90 Base) MCG/ACT inhaler, Inhale 1-2 puffs into the lungs every 6 (six) hours as needed for wheezing or shortness of breath., Disp: 8 g, Rfl: 3 .  cetirizine (ZYRTEC) 10 MG tablet, Take 1 tablet (10 mg total) by mouth daily., Disp: 90 tablet, Rfl: 4 .  Cholecalciferol (VITAMIN D3) 10 MCG (400 UNIT) CHEW, Chew by mouth., Disp: , Rfl:  .  fluticasone (FLONASE) 50 MCG/ACT nasal spray, Place 2 sprays into both nostrils daily., Disp: 11.1 mL, Rfl: 6 .  levothyroxine (SYNTHROID) 25 MCG tablet, Take 25 mcg by mouth daily., Disp: , Rfl:  .  montelukast (SINGULAIR) 10 MG tablet, Take 1 tablet (10 mg total) by mouth at bedtime., Disp: 90 tablet, Rfl: 3 .  Multiple Vitamin (MULTI-VITAMIN) tablet, Take by mouth., Disp: , Rfl:  .  omeprazole (PRILOSEC) 20 MG capsule, Take 1 capsule (20 mg total) by mouth daily., Disp: 90 capsule, Rfl: 3 .  verapamil (CALAN) 80 MG tablet, Take 1 tablet (80 mg total) by mouth  daily. (please contact the office is this rx differs from her previous. She was unsure the dose), Disp: 90 tablet, Rfl: 3  Allergies  Allergen Reactions  . Codeine Other (See Comments)  . Nitrofurantoin Other (See Comments)  . Ofloxacin Other (See Comments)  . Penicillins Other (See Comments)  . Sulfamethoxazole Other (See Comments)     ROS: Review of Systems Pertinent items noted in HPI and remainder of comprehensive ROS otherwise negative.    Physical exam BP 117/71   Pulse 92   Temp 97.6 F (36.4 C)   Ht 5\' 2"  (1.575 m)   Wt 167 lb 9.6 oz (76 kg)   SpO2 96%   BMI 30.65 kg/m  General appearance: alert, cooperative, appears stated age and no distress Head: Normocephalic, without obvious abnormality, atraumatic; mild micrognathia Eyes: negative findings: lids and lashes normal, conjunctivae and sclerae normal, corneas clear and pupils equal, round, reactive to light and accomodation Ears: normal TM's and external ear canals both ears Nose: Nares normal. Septum midline. Mucosa normal. No drainage or sinus tenderness. Throat: lips, mucosa, and tongue normal; teeth and gums normal Neck: no adenopathy, supple, symmetrical, trachea midline and thyroid not enlarged, symmetric, no tenderness/mass/nodules Back: symmetric, no curvature. ROM normal. No CVA tenderness. Lungs: clear to auscultation bilaterally Heart: regular rate and rhythm, S1, S2 normal, no murmur, click, rub or gallop Abdomen: soft, non-tender; bowel sounds normal; no masses,  no organomegaly Extremities: extremities normal, atraumatic, no cyanosis or edema Pulses: 2+ and symmetric Skin: Skin color, texture, turgor normal. No rashes or lesions Lymph nodes: Cervical, supraclavicular, and axillary nodes normal. Neurologic: Alert and oriented X 3, normal strength and tone. Normal symmetric reflexes. Normal coordination and gait Psych: Mood stable, speech normal, affect appropriate  Assessment/ Plan: Jodi Rios  here for annual physical exam.   1. Annual physical exam  2. Heart palpitations EKG unchanged from previous - EKG 12-Lead  3. Rhinosinusitis I discussed with patient I do not think that her symptoms are infectious at all.  However, she was very concerned about worsening and this turning into pneumonia.  Therefore I given her a pocket prescription which I asked the pharmacy to put on hold in the event that symptoms do not improve she can fill this next week.  I have reiterated need for sinus rinses, allergy control.  Encourage COVID-19 vaccination - azithromycin (ZITHROMAX) 250 MG tablet; Take 2 tablets today, then take 1 tablet daily until gone.  Dispense: 6 tablet; Refill: 0  4. Snoring Asked her to consider sleep study.  Her labs are unrevealing for etiology of chronic fatigue though given her profile she likely has obstructive sleep  apnea due to micrognathia  5. Excessive daytime sleepiness   Counseled on healthy lifestyle choices, including diet (rich in fruits, vegetables and lean meats and low in salt and simple carbohydrates) and exercise (at least 30 minutes of moderate physical activity daily).  Patient to follow up in 1 year for annual exam or sooner if needed.  Nasri Boakye M. Nadine Counts, DO

## 2019-10-05 DIAGNOSIS — Z23 Encounter for immunization: Secondary | ICD-10-CM | POA: Diagnosis not present

## 2019-10-10 DIAGNOSIS — L239 Allergic contact dermatitis, unspecified cause: Secondary | ICD-10-CM | POA: Diagnosis not present

## 2019-10-20 ENCOUNTER — Ambulatory Visit (INDEPENDENT_AMBULATORY_CARE_PROVIDER_SITE_OTHER): Payer: BC Managed Care – PPO | Admitting: Family Medicine

## 2019-10-20 ENCOUNTER — Encounter: Payer: Self-pay | Admitting: Family Medicine

## 2019-10-20 DIAGNOSIS — J4521 Mild intermittent asthma with (acute) exacerbation: Secondary | ICD-10-CM

## 2019-10-20 DIAGNOSIS — J069 Acute upper respiratory infection, unspecified: Secondary | ICD-10-CM

## 2019-10-20 MED ORDER — METHYLPREDNISOLONE 4 MG PO TBPK: ORAL_TABLET | ORAL | 0 refills | Status: DC

## 2019-10-20 NOTE — Progress Notes (Signed)
Virtual Visit via Telephone Note  I connected with Jodi Rios on 10/20/19 at 5:10 PM by telephone and verified that I am speaking with the correct person using two identifiers. Jodi Rios is currently located at home and nobody is currently with her during this visit. The provider, Gwenlyn Fudge, FNP is located in their office at time of visit.  I discussed the limitations, risks, security and privacy concerns of performing an evaluation and management service by telephone and the availability of in person appointments. I also discussed with the patient that there may be a patient responsible charge related to this service. The patient expressed understanding and agreed to proceed.  Subjective: PCP: Raliegh Ip, DO  Chief Complaint  Patient presents with  . Asthma   Patient complains of cough, head/chest congestion, postnasal drainage, shortness of breath and wheezing.  Onset of symptoms was today. She is drinking plenty of fluids. Evaluation to date: none. Treatment to date: inhaler, nasal spray, and saline nasal wash. She has a history of allergies and asthma. She does not smoke.    ROS: Per HPI  Current Outpatient Medications:  .  albuterol (VENTOLIN HFA) 108 (90 Base) MCG/ACT inhaler, Inhale 1-2 puffs into the lungs every 6 (six) hours as needed for wheezing or shortness of breath., Disp: 8 g, Rfl: 3 .  azithromycin (ZITHROMAX) 250 MG tablet, Take 2 tablets today, then take 1 tablet daily until gone., Disp: 6 tablet, Rfl: 0 .  cetirizine (ZYRTEC) 10 MG tablet, Take 1 tablet (10 mg total) by mouth daily., Disp: 90 tablet, Rfl: 4 .  Cholecalciferol (VITAMIN D3) 10 MCG (400 UNIT) CHEW, Chew by mouth., Disp: , Rfl:  .  fluticasone (FLONASE) 50 MCG/ACT nasal spray, Place 2 sprays into both nostrils daily., Disp: 11.1 mL, Rfl: 6 .  levothyroxine (SYNTHROID) 25 MCG tablet, Take 25 mcg by mouth daily., Disp: , Rfl:  .  montelukast (SINGULAIR) 10 MG tablet, Take 1 tablet (10  mg total) by mouth at bedtime., Disp: 90 tablet, Rfl: 3 .  Multiple Vitamin (MULTI-VITAMIN) tablet, Take by mouth., Disp: , Rfl:  .  omeprazole (PRILOSEC) 20 MG capsule, Take 1 capsule (20 mg total) by mouth daily., Disp: 90 capsule, Rfl: 3 .  verapamil (CALAN) 80 MG tablet, Take 1 tablet (80 mg total) by mouth daily. (please contact the office is this rx differs from her previous. She was unsure the dose), Disp: 90 tablet, Rfl: 3  Allergies  Allergen Reactions  . Codeine Other (See Comments)  . Nitrofurantoin Other (See Comments)  . Ofloxacin Other (See Comments)  . Penicillins Other (See Comments)  . Sulfamethoxazole Other (See Comments)   Past Medical History:  Diagnosis Date  . Anemia   . GERD (gastroesophageal reflux disease)   . Mitral valve prolapse 1990  . Thyroid disease     Observations/Objective: A&O  No respiratory distress or wheezing audible over the phone Mood, judgement, and thought processes all WNL  Assessment and Plan: 1. Mild intermittent asthma with exacerbation/Viral illness - Encouraged testing for COVID-19. Discussed symptom management.  - methylPREDNISolone (MEDROL DOSEPAK) 4 MG TBPK tablet; Use as directed  Dispense: 21 each; Refill: 0   Follow Up Instructions:  I discussed the assessment and treatment plan with the patient. The patient was provided an opportunity to ask questions and all were answered. The patient agreed with the plan and demonstrated an understanding of the instructions.   The patient was advised to call back or seek  an in-person evaluation if the symptoms worsen or if the condition fails to improve as anticipated.  The above assessment and management plan was discussed with the patient. The patient verbalized understanding of and has agreed to the management plan. Patient is aware to call the clinic if symptoms persist or worsen. Patient is aware when to return to the clinic for a follow-up visit. Patient educated on when it is  appropriate to go to the emergency department.   Time call ended: 5:17 PM  I provided 9 minutes of non-face-to-face time during this encounter.  Deliah Boston, MSN, APRN, FNP-C [facility] 10/20/19

## 2019-10-22 DIAGNOSIS — Z1159 Encounter for screening for other viral diseases: Secondary | ICD-10-CM | POA: Diagnosis not present

## 2019-10-26 DIAGNOSIS — Z23 Encounter for immunization: Secondary | ICD-10-CM | POA: Diagnosis not present

## 2019-11-04 ENCOUNTER — Telehealth: Payer: Self-pay | Admitting: Family Medicine

## 2019-11-04 NOTE — Telephone Encounter (Signed)
Pt asked to talk to [place]. I asked for the message and she told me to just give Nadine Counts her name number for her to call back

## 2019-11-04 NOTE — Telephone Encounter (Signed)
Pt has a video visit on 11/05/19

## 2019-11-05 ENCOUNTER — Encounter: Payer: Self-pay | Admitting: Family

## 2019-11-05 ENCOUNTER — Telehealth (INDEPENDENT_AMBULATORY_CARE_PROVIDER_SITE_OTHER): Payer: BC Managed Care – PPO | Admitting: Family

## 2019-11-05 DIAGNOSIS — J4521 Mild intermittent asthma with (acute) exacerbation: Secondary | ICD-10-CM

## 2019-11-05 MED ORDER — DOXYCYCLINE HYCLATE 100 MG PO TABS: 100.0000 mg | ORAL_TABLET | Freq: Two times a day (BID) | ORAL | 0 refills | Status: DC

## 2019-11-05 MED ORDER — PREDNISONE 10 MG (21) PO TBPK: ORAL_TABLET | ORAL | 0 refills | Status: DC

## 2019-11-05 NOTE — Progress Notes (Signed)
   Virtual Visit via telephone Note Due to COVID-19 pandemic this visit was conducted virtually. This visit type was conducted due to national recommendations for restrictions regarding the COVID-19 Pandemic (e.g. social distancing, sheltering in place) in an effort to limit this patient's exposure and mitigate transmission in our community. All issues noted in this document were discussed and addressed.  A physical exam was not performed with this format.  I connected with Jodi Rios on 11/05/19 at 6:02 pm by telephone and verified that I am speaking with the correct person using two identifiers. Jodi Rios is currently located at home and no one is currently with her during visit. The provider, Jannifer Rodney, FNP is located in their office at time of visit.  I discussed the limitations, risks, security and privacy concerns of performing an evaluation and management service by telephone and the availability of in person appointments. I also discussed with the patient that there may be a patient responsible charge related to this service. The patient expressed understanding and agreed to proceed.   History and Present Illness: Pt calls the office today with cough that started two weeks ago. She was given steroids that helped, but continues to have symptoms. She was tested for COVID and it was negative. Cough This is a new problem. The current episode started 1 to 4 weeks ago. The problem has been waxing and waning. The problem occurs every few minutes. The cough is productive of purulent sputum. Associated symptoms include chills, headaches, nasal congestion, shortness of breath and wheezing. Pertinent negatives include no ear congestion, ear pain, fever, myalgias, postnasal drip or sore throat. She has tried OTC inhaler for the symptoms. The treatment provided mild relief. Her past medical history is significant for asthma.    Review of Systems  Constitutional: Positive for chills. Negative  for fever.  HENT: Negative for ear pain, postnasal drip and sore throat.   Respiratory: Positive for cough, shortness of breath and wheezing.   Musculoskeletal: Negative for myalgias.  Neurological: Positive for headaches.     Observations/Objective: No SOB or distress noted  Assessment and Plan: 1. Mild intermittent asthma with exacerbation Continue Singulair and zyrtec daily Avoid allergens Use albuterol as needed - doxycycline (VIBRA-TABS) 100 MG tablet; Take 1 tablet (100 mg total) by mouth 2 (two) times daily.  Dispense: 20 tablet; Refill: 0 - predniSONE (STERAPRED UNI-PAK 21 TAB) 10 MG (21) TBPK tablet; Use as directed  Dispense: 21 tablet; Refill: 0     I discussed the assessment and treatment plan with the patient. The patient was provided an opportunity to ask questions and all were answered. The patient agreed with the plan and demonstrated an understanding of the instructions.   The patient was advised to call back or seek an in-person evaluation if the symptoms worsen or if the condition fails to improve as anticipated.  The above assessment and management plan was discussed with the patient. The patient verbalized understanding of and has agreed to the management plan. Patient is aware to call the clinic if symptoms persist or worsen. Patient is aware when to return to the clinic for a follow-up visit. Patient educated on when it is appropriate to go to the emergency department.   Time call ended:  6:14 pm  I provided 12 minutes of non-face-to-face time during this encounter.    Jannifer Rodney, FNP

## 2019-11-06 ENCOUNTER — Other Ambulatory Visit: Payer: Self-pay | Admitting: Family

## 2019-11-06 DIAGNOSIS — J4521 Mild intermittent asthma with (acute) exacerbation: Secondary | ICD-10-CM

## 2019-11-27 ENCOUNTER — Telehealth: Payer: Self-pay

## 2019-11-27 DIAGNOSIS — R059 Cough, unspecified: Secondary | ICD-10-CM | POA: Diagnosis not present

## 2019-11-27 DIAGNOSIS — J329 Chronic sinusitis, unspecified: Secondary | ICD-10-CM | POA: Diagnosis not present

## 2019-11-27 DIAGNOSIS — Z6830 Body mass index (BMI) 30.0-30.9, adult: Secondary | ICD-10-CM | POA: Diagnosis not present

## 2019-11-27 NOTE — Telephone Encounter (Signed)
Patient is experiencing shortness of breath and cough.  She was seen a few weeks ago and treated with antibiotic and prednisone, symptoms have returned.  Patient wanted to come in to be seen and evaluated, possibly get a chest xray.  I explained to the patient that due to her symptoms we were unable to bring her into the office but we could do a video visit with her.  Patient declined and prefers to go to the ER instead.

## 2019-12-07 DIAGNOSIS — J45909 Unspecified asthma, uncomplicated: Secondary | ICD-10-CM | POA: Diagnosis not present

## 2020-02-02 ENCOUNTER — Telehealth: Payer: Self-pay

## 2020-02-03 ENCOUNTER — Other Ambulatory Visit: Payer: Self-pay | Admitting: Family Medicine

## 2020-02-03 NOTE — Telephone Encounter (Signed)
Fax from [facility] pharmacy RF request for Albuterol Sulf 0.083% 1 vial neb QID for wheezing 360 ml Not on current med list Last OV 11/05/19 next OV not sched

## 2020-02-24 ENCOUNTER — Other Ambulatory Visit: Payer: Self-pay | Admitting: Family Medicine

## 2020-03-15 DIAGNOSIS — J0100 Acute maxillary sinusitis, unspecified: Secondary | ICD-10-CM | POA: Diagnosis not present

## 2020-03-15 DIAGNOSIS — Z6830 Body mass index (BMI) 30.0-30.9, adult: Secondary | ICD-10-CM | POA: Diagnosis not present

## 2020-04-08 ENCOUNTER — Ambulatory Visit: Payer: BC Managed Care – PPO | Admitting: Family Medicine

## 2020-04-18 DIAGNOSIS — R0602 Shortness of breath: Secondary | ICD-10-CM | POA: Diagnosis not present

## 2020-04-18 DIAGNOSIS — R059 Cough, unspecified: Secondary | ICD-10-CM | POA: Diagnosis not present

## 2020-04-18 DIAGNOSIS — J45909 Unspecified asthma, uncomplicated: Secondary | ICD-10-CM | POA: Diagnosis not present

## 2020-04-27 ENCOUNTER — Telehealth: Payer: Self-pay

## 2020-04-27 MED ORDER — LEVOTHYROXINE SODIUM 25 MCG PO TABS: 25.0000 ug | ORAL_TABLET | Freq: Every day | ORAL | 0 refills | Status: DC

## 2020-04-27 NOTE — Telephone Encounter (Signed)
  Prescription Request  04/27/2020  What is the name of the medication or equipment? levothyroxine (SYNTHROID) 25 MCG tablet   Have you contacted your pharmacy to request a refill? (if applicable) yes  Which pharmacy would you like this sent to? [facility]    Patient notified that their request is being sent to the clinical staff for review and that they should receive a response within 2 business days.

## 2020-05-02 ENCOUNTER — Other Ambulatory Visit: Payer: Self-pay

## 2020-05-02 ENCOUNTER — Ambulatory Visit: Payer: BC Managed Care – PPO | Admitting: Family Medicine

## 2020-05-02 VITALS — BP 118/80 | HR 87 | Temp 97.8°F | Ht 62.0 in | Wt 167.0 lb

## 2020-05-02 DIAGNOSIS — J4520 Mild intermittent asthma, uncomplicated: Secondary | ICD-10-CM

## 2020-05-02 DIAGNOSIS — E034 Atrophy of thyroid (acquired): Secondary | ICD-10-CM

## 2020-05-02 DIAGNOSIS — R002 Palpitations: Secondary | ICD-10-CM | POA: Diagnosis not present

## 2020-05-02 MED ORDER — LEVOTHYROXINE SODIUM 25 MCG PO TABS: 25.0000 ug | ORAL_TABLET | Freq: Every day | ORAL | 3 refills | Status: DC

## 2020-05-02 MED ORDER — OMEPRAZOLE 20 MG PO CPDR: 20.0000 mg | DELAYED_RELEASE_CAPSULE | Freq: Every day | ORAL | 3 refills | Status: DC

## 2020-05-02 MED ORDER — FLUTICASONE PROPIONATE 50 MCG/ACT NA SUSP: 2.0000 | Freq: Every day | NASAL | 6 refills | Status: DC

## 2020-05-02 MED ORDER — VERAPAMIL HCL 80 MG PO TABS: 80.0000 mg | ORAL_TABLET | Freq: Every day | ORAL | 3 refills | Status: DC

## 2020-05-02 MED ORDER — CETIRIZINE HCL 10 MG PO TABS: 10.0000 mg | ORAL_TABLET | Freq: Every day | ORAL | 4 refills | Status: DC

## 2020-05-02 NOTE — Progress Notes (Addendum)
Subjective: CC: Hypothyroidism PCP: Raliegh Ip, DO SFK:CLEXN Jodi Rios is a 60 y.o. female presenting to clinic today for:  1.  Hypothyroidism Patient is compliant with Synthroid 25 mcg daily.  New change in voice, heart palpitations, change in bowel habit.  She does report some hair thinning.  She is not on any hair skin nail vitamins.  2.  Heart palpitations Patient has not had any heart palpitations.  She continues to take verapamil daily as prescribed.  3.  Asthma Patient reports that her asthma has been flaring over the last few weeks.  She is taking albuterol intermittently.  For a while she was taking it regularly but she is not used in the last several days.  Does not want to be on any powder controller medications.  Will think about aerosolized controller medications   ROS: Per HPI  Allergies  Allergen Reactions  . Codeine Other (See Comments)  . Nitrofurantoin Other (See Comments)  . Ofloxacin Other (See Comments)  . Penicillins Other (See Comments)  . Sulfamethoxazole Other (See Comments)   Past Medical History:  Diagnosis Date  . Anemia   . GERD (gastroesophageal reflux disease)   . Mitral valve prolapse 1990  . Thyroid disease     Current Outpatient Medications:  .  albuterol (VENTOLIN HFA) 108 (90 Base) MCG/ACT inhaler, Inhale 1-2 puffs into the lungs every 6 (six) hours as needed for wheezing or shortness of breath., Disp: 8 g, Rfl: 3 .  cetirizine (ZYRTEC) 10 MG tablet, Take 1 tablet (10 mg total) by mouth daily., Disp: 90 tablet, Rfl: 4 .  Cholecalciferol (VITAMIN D3) 10 MCG (400 UNIT) CHEW, Chew by mouth., Disp: , Rfl:  .  fluticasone (FLONASE) 50 MCG/ACT nasal spray, Place 2 sprays into both nostrils daily., Disp: 11.1 mL, Rfl: 6 .  levothyroxine (SYNTHROID) 25 MCG tablet, Take 1 tablet (25 mcg total) by mouth daily., Disp: 30 tablet, Rfl: 0 .  montelukast (SINGULAIR) 10 MG tablet, Take 1 tablet (10 mg total) by mouth at bedtime., Disp: 90 tablet,  Rfl: 3 .  Multiple Vitamin (MULTI-VITAMIN) tablet, Take by mouth., Disp: , Rfl:  .  omeprazole (PRILOSEC) 20 MG capsule, Take 1 capsule (20 mg total) by mouth daily., Disp: 90 capsule, Rfl: 0 .  verapamil (CALAN) 80 MG tablet, Take 1 tablet (80 mg total) by mouth daily., Disp: 90 tablet, Rfl: 0 Social History   Socioeconomic History  . Marital status: Divorced    Spouse name: Not on file  . Number of children: 2  . Years of education: Not on file  . Highest education level: Not on file  Occupational History  . 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 Smoking status: Never Smoker  . Smokeless tobacco: Current User  Vaping Use  . Vaping Use: Never used  Substance and Sexual Activity  . Alcohol use: Not Currently  . Drug use: Never  . Sexual activity: Not Currently  Other Topics Concern  . Not on file  Social History Narrative   Patient is divorced.  She has 2 sons.  One son resides with her.  She has 3 grandchildren and 2 step grandchildren.   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   Social Determinants of Health   Financial Resource Strain: Not on file  Food Insecurity: Not on file  Transportation Needs: Not on file  Physical Activity: Not  on file  Stress: Not on file  Social Connections: Not on file  Intimate Partner Violence: Not on file   Family History  Problem Relation Age of Onset  . Stroke Mother   . Thyroid disease Mother   . Hyperlipidemia Father   . Diabetes Son   . Cervical cancer Sister   . Multiple sclerosis Sister     Objective: Office vital signs reviewed. BP 118/80   Pulse 87   Temp 97.8 F (36.6 C) (Temporal)   Ht 5\' 2"  (1.575 m)   Wt 167 lb (75.8 kg)   SpO2 98%   BMI 30.54 kg/m   Physical Examination:  General: Awake, alert, well nourished, No acute distress HEENT: Normal; sclera white.  No goiter.  No exophthalmos Cardio: regular rate and rhythm, S1S2  heard, no murmurs appreciated Pulm: clear to auscultation bilaterally, no wheezes, rhonchi or rales; normal work of breathing on room air Skin: dry; intact; no rashes or lesions; normal temperature Neuro: No tremor  Assessment/ Plan: 60 y.o. female   Hypothyroidism due to acquired atrophy of thyroid - Plan: Thyroid Panel With TSH  Heart palpitations  Mild intermittent asthma without complication - Plan: cetirizine (ZYRTEC) 10 MG tablet, fluticasone (FLONASE) 50 MCG/ACT nasal spray  Asymptomatic from a thyroid standpoint except for hair thinning.  Check thyroid panel  No heart palpitations.  Continue verapamil  Discussed aerosolized inhaler.  Would consider Symbicort in this patient.  We discussed limiting use of albuterol.  She will contact me should she decide to proceed with inhaler.  No orders of the defined types were placed in this encounter.  Meds ordered this encounter  Medications  . verapamil (CALAN) 80 MG tablet    Sig: Take 1 tablet (80 mg total) by mouth daily.    Dispense:  90 tablet    Refill:  3  . levothyroxine (SYNTHROID) 25 MCG tablet    Sig: Take 1 tablet (25 mcg total) by mouth daily.    Dispense:  90 tablet    Refill:  3  . cetirizine (ZYRTEC) 10 MG tablet    Sig: Take 1 tablet (10 mg total) by mouth daily.    Dispense:  90 tablet    Refill:  4  . fluticasone (FLONASE) 50 MCG/ACT nasal spray    Sig: Place 2 sprays into both nostrils daily.    Dispense:  11.1 mL    Refill:  6  . omeprazole (PRILOSEC) 20 MG capsule    Sig: Take 1 capsule (20 mg total) by mouth daily.    Dispense:  90 capsule    Refill:  3    Benno Brensinger 66, DO [facility] [phone]

## 2020-05-02 NOTE — Addendum Note (Signed)
Addended by: Raliegh Ip on: 05/02/2020 10:35 AM   Modules accepted: Orders

## 2020-05-03 LAB — THYROID PANEL WITH TSH
Free Thyroxine Index: 2.2 (ref 1.2–4.9)
T3 Uptake Ratio: 25 % (ref 24–39)
T4, Total: 8.6 ug/dL (ref 4.5–12.0)
TSH: 1.61 u[IU]/mL (ref 0.450–4.500)

## 2020-05-11 ENCOUNTER — Ambulatory Visit: Payer: BC Managed Care – PPO | Admitting: Family Medicine

## 2020-05-17 DIAGNOSIS — J45909 Unspecified asthma, uncomplicated: Secondary | ICD-10-CM | POA: Diagnosis not present

## 2020-05-17 DIAGNOSIS — R911 Solitary pulmonary nodule: Secondary | ICD-10-CM | POA: Diagnosis not present

## 2020-05-19 DIAGNOSIS — R918 Other nonspecific abnormal finding of lung field: Secondary | ICD-10-CM | POA: Diagnosis not present

## 2020-05-19 DIAGNOSIS — J988 Other specified respiratory disorders: Secondary | ICD-10-CM | POA: Diagnosis not present

## 2020-05-19 DIAGNOSIS — R059 Cough, unspecified: Secondary | ICD-10-CM | POA: Diagnosis not present

## 2020-05-31 DIAGNOSIS — J301 Allergic rhinitis due to pollen: Secondary | ICD-10-CM | POA: Diagnosis not present

## 2020-05-31 DIAGNOSIS — J45909 Unspecified asthma, uncomplicated: Secondary | ICD-10-CM | POA: Diagnosis not present

## 2020-06-27 DIAGNOSIS — Z124 Encounter for screening for malignant neoplasm of cervix: Secondary | ICD-10-CM | POA: Diagnosis not present

## 2020-06-27 DIAGNOSIS — Z01419 Encounter for gynecological examination (general) (routine) without abnormal findings: Secondary | ICD-10-CM | POA: Diagnosis not present

## 2020-06-27 DIAGNOSIS — Z1231 Encounter for screening mammogram for malignant neoplasm of breast: Secondary | ICD-10-CM | POA: Diagnosis not present

## 2020-07-13 ENCOUNTER — Encounter: Payer: Self-pay | Admitting: Family Medicine

## 2020-08-05 ENCOUNTER — Telehealth: Payer: Self-pay | Admitting: Family Medicine

## 2020-08-05 DIAGNOSIS — R918 Other nonspecific abnormal finding of lung field: Secondary | ICD-10-CM | POA: Diagnosis not present

## 2020-08-05 DIAGNOSIS — J984 Other disorders of lung: Secondary | ICD-10-CM | POA: Diagnosis not present

## 2020-08-05 NOTE — Telephone Encounter (Signed)
Took at home test today and it is positive. She advise to treat symptoms. Call if worsening

## 2020-08-13 ENCOUNTER — Other Ambulatory Visit: Payer: Self-pay | Admitting: Family

## 2020-08-13 DIAGNOSIS — J4520 Mild intermittent asthma, uncomplicated: Secondary | ICD-10-CM

## 2020-09-29 ENCOUNTER — Other Ambulatory Visit: Payer: Self-pay

## 2020-09-29 ENCOUNTER — Other Ambulatory Visit: Payer: BC Managed Care – PPO

## 2020-09-29 DIAGNOSIS — E034 Atrophy of thyroid (acquired): Secondary | ICD-10-CM

## 2020-09-29 DIAGNOSIS — Z130 Encounter for screening for diseases of the blood and blood-forming organs and certain disorders involving the immune mechanism: Secondary | ICD-10-CM

## 2020-09-29 DIAGNOSIS — Z13220 Encounter for screening for lipoid disorders: Secondary | ICD-10-CM

## 2020-09-30 LAB — THYROID PANEL WITH TSH
Free Thyroxine Index: 3 (ref 1.2–4.9)
T3 Uptake Ratio: 26 % (ref 24–39)
T4, Total: 11.7 ug/dL (ref 4.5–12.0)
TSH: 2.54 u[IU]/mL (ref 0.450–4.500)

## 2020-09-30 LAB — CMP14+EGFR
ALT: 25 IU/L (ref 0–32)
AST: 24 IU/L (ref 0–40)
Albumin/Globulin Ratio: 1.5 (ref 1.2–2.2)
Albumin: 3.7 g/dL — ABNORMAL LOW (ref 3.8–4.9)
Alkaline Phosphatase: 136 IU/L — ABNORMAL HIGH (ref 44–121)
BUN/Creatinine Ratio: 21 (ref 12–28)
BUN: 15 mg/dL (ref 8–27)
Bilirubin Total: 0.4 mg/dL (ref 0.0–1.2)
CO2: 22 mmol/L (ref 20–29)
Calcium: 8.9 mg/dL (ref 8.7–10.3)
Chloride: 106 mmol/L (ref 96–106)
Creatinine, Ser: 0.71 mg/dL (ref 0.57–1.00)
Globulin, Total: 2.4 g/dL (ref 1.5–4.5)
Glucose: 86 mg/dL (ref 65–99)
Potassium: 4.2 mmol/L (ref 3.5–5.2)
Sodium: 143 mmol/L (ref 134–144)
Total Protein: 6.1 g/dL (ref 6.0–8.5)
eGFR: 97 mL/min/{1.73_m2} (ref >59)

## 2020-09-30 LAB — CBC WITH DIFFERENTIAL/PLATELET
Basophils Absolute: 0.1 10*3/uL (ref 0.0–0.2)
Basos: 1 %
EOS (ABSOLUTE): 0.2 10*3/uL (ref 0.0–0.4)
Eos: 2 %
Hematocrit: 42.9 % (ref 34.0–46.6)
Hemoglobin: 14.2 g/dL (ref 11.1–15.9)
Immature Grans (Abs): 0 10*3/uL (ref 0.0–0.1)
Immature Granulocytes: 0 %
Lymphocytes Absolute: 1.8 10*3/uL (ref 0.7–3.1)
Lymphs: 27 %
MCH: 28.9 pg (ref 26.6–33.0)
MCHC: 33.1 g/dL (ref 31.5–35.7)
MCV: 87 fL (ref 79–97)
Monocytes Absolute: 0.8 10*3/uL (ref 0.1–0.9)
Monocytes: 12 %
Neutrophils Absolute: 3.8 10*3/uL (ref 1.4–7.0)
Neutrophils: 58 %
Platelets: 293 10*3/uL (ref 150–450)
RBC: 4.92 x10E6/uL (ref 3.77–5.28)
RDW: 12.8 % (ref 11.7–15.4)
WBC: 6.7 10*3/uL (ref 3.4–10.8)

## 2020-09-30 LAB — LIPID PANEL
Chol/HDL Ratio: 2.4 ratio (ref 0.0–4.4)
Cholesterol, Total: 138 mg/dL (ref 100–199)
HDL: 58 mg/dL (ref >39)
LDL Chol Calc (NIH): 66 mg/dL (ref 0–99)
Triglycerides: 70 mg/dL (ref 0–149)
VLDL Cholesterol Cal: 14 mg/dL (ref 5–40)

## 2020-10-03 ENCOUNTER — Encounter: Payer: BC Managed Care – PPO | Admitting: Family Medicine

## 2020-10-19 ENCOUNTER — Encounter: Payer: Self-pay | Admitting: Family Medicine

## 2020-10-19 ENCOUNTER — Ambulatory Visit (INDEPENDENT_AMBULATORY_CARE_PROVIDER_SITE_OTHER): Payer: BC Managed Care – PPO | Admitting: Family Medicine

## 2020-10-19 ENCOUNTER — Other Ambulatory Visit: Payer: Self-pay

## 2020-10-19 VITALS — BP 124/89 | HR 88 | Temp 97.5°F | Ht 62.0 in | Wt 168.4 lb

## 2020-10-19 DIAGNOSIS — E034 Atrophy of thyroid (acquired): Secondary | ICD-10-CM | POA: Diagnosis not present

## 2020-10-19 DIAGNOSIS — Z6830 Body mass index (BMI) 30.0-30.9, adult: Secondary | ICD-10-CM

## 2020-10-19 DIAGNOSIS — Z0000 Encounter for general adult medical examination without abnormal findings: Secondary | ICD-10-CM

## 2020-10-19 DIAGNOSIS — Z0001 Encounter for general adult medical examination with abnormal findings: Secondary | ICD-10-CM

## 2020-10-19 DIAGNOSIS — J4520 Mild intermittent asthma, uncomplicated: Secondary | ICD-10-CM | POA: Diagnosis not present

## 2020-10-19 NOTE — Patient Instructions (Addendum)
I got the note from Jodi Rios but there was no pap result in it.  I have the mammogram.  Preventive Care 61-61 Years Old, Female Preventive care refers to lifestyle choices and visits with your health care provider that can promote health and wellness. This includes: A yearly physical exam. This is also called an annual wellness visit. Regular dental and eye exams. Immunizations. Screening for certain conditions. Healthy lifestyle choices, such as: Eating a healthy diet. Getting regular exercise. Not using drugs or products that contain nicotine and tobacco. Limiting alcohol use. What can I expect for my preventive care visit? Physical exam Your health care provider will check your: Height and weight. These may be used to calculate your BMI (body mass index). BMI is a measurement that tells if you are at a healthy weight. Heart rate and blood pressure. Body temperature. Skin for abnormal spots. Counseling Your health care provider may ask you questions about your: Past medical problems. Family's medical history. Alcohol, tobacco, and drug use. Emotional well-being. Home life and relationship well-being. Sexual activity. Diet, exercise, and sleep habits. Work and work Statistician. Access to firearms. Method of birth control. Menstrual cycle. Pregnancy history. What immunizations do I need?  Vaccines are usually given at various ages, according to a schedule. Your health care provider will recommend vaccines for you based on your age, medicalhistory, and lifestyle or other factors, such as travel or where you work. What tests do I need? Blood tests Lipid and cholesterol levels. These may be checked every 5 years, or more often if you are over 35 years old. Hepatitis C test. Hepatitis B test. Screening Lung cancer screening. You may have this screening every year starting at age 38 if you have a 30-pack-year history of smoking and currently smoke or have quit within  the past 15 years. Colorectal cancer screening. All adults should have this screening starting at age 61 and continuing until age 5. Your health care provider may recommend screening at age 61 if you are at increased risk. You will have tests every 1-10 years, depending on your results and the type of screening test. Diabetes screening. This is done by checking your blood sugar (glucose) after you have not eaten for a while (fasting). You may have this done every 1-3 years. Mammogram. This may be done every 1-2 years. Talk with your health care provider about when you should start having regular mammograms. This may depend on whether you have a family history of breast cancer. BRCA-related cancer screening. This may be done if you have a family history of breast, ovarian, tubal, or peritoneal cancers. Pelvic exam and Pap test. This may be done every 3 years starting at age 23. Starting at age 8, this may be done every 5 years if you have a Pap test in combination with an HPV test. Other tests STD (sexually transmitted disease) testing, if you are at risk. Bone density scan. This is done to screen for osteoporosis. You may have this scan if you are at high risk for osteoporosis. Talk with your health care provider about your test results, treatment options,and if necessary, the need for more tests. Follow these instructions at home: Eating and drinking  Eat a diet that includes fresh fruits and vegetables, whole grains, lean protein, and low-fat dairy products. Take vitamin and mineral supplements as recommended by your health care provider. Do not drink alcohol if: Your health care provider tells you not to drink. You are pregnant, may be pregnant,  or are planning to become pregnant. If you drink alcohol: Limit how much you have to 0-1 drink a day. Be aware of how much alcohol is in your drink. In the [place] one drink equals one 12 oz bottle of beer (355 mL), one 5 oz glass of wine (148  mL), or one 1 oz glass of hard liquor (44 mL).  Lifestyle Take daily care of your teeth and gums. Brush your teeth every morning and night with fluoride toothpaste. Floss one time each day. Stay active. Exercise for at least 30 minutes 5 or more days each week. Do not use any products that contain nicotine or tobacco, such as cigarettes, e-cigarettes, and chewing tobacco. If you need help quitting, ask your health care provider. Do not use drugs. If you are sexually active, practice safe sex. Use a condom or other form of protection to prevent STIs (sexually transmitted infections). If you do not wish to become pregnant, use a form of birth control. If you plan to become pregnant, see your health care provider for a prepregnancy visit. If told by your health care provider, take low-dose aspirin daily starting at age 61. Find healthy ways to cope with stress, such as: Meditation, yoga, or listening to music. Journaling. Talking to a trusted person. Spending time with friends and family. Safety Always wear your seat belt while driving or riding in a vehicle. Do not drive: If you have been drinking alcohol. Do not ride with someone who has been drinking. When you are tired or distracted. While texting. Wear a helmet and other protective equipment during sports activities. If you have firearms in your house, make sure you follow all gun safety procedures. What's next? Visit your health care provider once a year for an annual wellness visit. Ask your health care provider how often you should have your eyes and teeth checked. Stay up to date on all vaccines. This information is not intended to replace advice given to you by your health care provider. Make sure you discuss any questions you have with your healthcare provider. Document Revised: 11/17/2019 Document Reviewed: 10/24/2017 Elsevier Patient Education  2022 [employer].

## 2020-10-19 NOTE — Progress Notes (Signed)
Jodi Rios is a 60 y.o. female presents to office today for annual physical exam examination.    Concerns today include: 1. none  Occupation: Clinical biochemist ,Substance use: none Diet: fair, Exercise: active at work Last eye exam: due soon Last dental exam:  UTD Last colonoscopy: UTD Last mammogram: UTD Last pap smear: UTD 06/2020. Refills needed today: none Immunizations needed: Immunization History  Administered Date(s) Administered   Fluad Quad(high Dose 65+) 11/27/2019   Influenza Split 11/23/2019   Influenza, Seasonal, Injecte, Preservative Fre 10/27/2017   Influenza,inj,quad, With Preservative 11/27/2018   PFIZER(Purple Top)SARS-COV-2 Vaccination 10/05/2019, 10/26/2019   Tdap 08/17/2019     Past Medical History:  Diagnosis Date   Anemia    GERD (gastroesophageal reflux disease)    Mitral valve prolapse 1990   Thyroid disease    Social History   Socioeconomic History   Marital status: Divorced    Spouse name: Not on file   Number of children: 2   Years of education: Not on file   Highest education level: Not on file  Occupational History   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  She has 2 sons.  One son resides with her.  She has 3 grandchildren and 2 step grandchildren.   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   Social Determinants of Health   Financial Resource Strain: Not on file  Food Insecurity: Not on file  Transportation Needs: Not on file  Physical Activity: Not on file  Stress: Not on file  Social Connections: Not on file  Intimate Partner Violence: Not on file   Past Surgical  History:  Procedure Laterality Date   CESAREAN SECTION     CHOLECYSTECTOMY     Family History  Problem Relation Age of Onset   Stroke Mother    Thyroid disease Mother    Hyperlipidemia Father    Diabetes Son    Cervical cancer Sister    Multiple sclerosis Sister     Current Outpatient Medications:    albuterol (VENTOLIN HFA) 108 (90 Base) MCG/ACT inhaler, Inhale 1-2 puffs into the lungs every 6 (six) hours as needed for wheezing or shortness of breath., Disp: 8 g, Rfl: 3   ascorbic Acid (VITAMIN C) 500 MG CPCR, Take by mouth., Disp: , Rfl:    cetirizine (ZYRTEC) 10 MG tablet, Take 1 tablet (10 mg total) by mouth daily., Disp: 90 tablet, Rfl: 1   Cholecalciferol (VITAMIN D3) 10 MCG (400 UNIT) CHEW, Chew by mouth., Disp: , Rfl:    fluticasone (FLONASE) 50 MCG/ACT nasal spray, Place 2 sprays into both nostrils daily., Disp: 11.1 mL, Rfl: 6   levothyroxine (SYNTHROID) 25 MCG tablet, Take 1 tablet (25 mcg total) by mouth daily., Disp: 90 tablet, Rfl: 3   montelukast (SINGULAIR) 10 MG tablet, Take 1 tablet (10 mg total) by mouth at bedtime., Disp: 90 tablet, Rfl: 3   Multiple Vitamin (MULTI-VITAMIN) tablet, Take by mouth., Disp: , Rfl:    omeprazole (PRILOSEC) 20 MG capsule, Take 1 capsule (20 mg total) by mouth daily.,  Disp: 90 capsule, Rfl: 3   verapamil (CALAN) 80 MG tablet, Take 1 tablet (80 mg total) by mouth daily., Disp: 90 tablet, Rfl: 3  Allergies  Allergen Reactions   Codeine Other (See Comments)   Nitrofurantoin Other (See Comments)   Ofloxacin Other (See Comments)   Penicillins Other (See Comments)   Sulfamethoxazole Other (See Comments)     ROS: Review of Systems Pertinent items noted in HPI and remainder of comprehensive ROS otherwise negative.    Physical exam BP 124/89   Pulse 88   Temp (!) 97.5 F (36.4 C)   Ht 5\' 2"  (1.575 m)   Wt 168 lb 6.4 oz (76.4 kg)   SpO2 95%   BMI 30.80 kg/m  General appearance: alert, cooperative, appears stated age, and no  distress Head: Normocephalic, without obvious abnormality, atraumatic Eyes: negative findings: lids and lashes normal, conjunctivae and sclerae normal, corneas clear, pupils equal, round, reactive to light and accomodation, and wears glasses Ears: normal TM's and external ear canals both ears Nose: Nares normal. Septum midline. Mucosa normal. No drainage or sinus tenderness. Throat: lips, mucosa, and tongue normal; teeth and gums normal Neck: no adenopathy, no carotid bruit, supple, symmetrical, trachea midline, and mild thyroid fullness without masses/ nodules Back: symmetric, no curvature. ROM normal. No CVA tenderness. Lungs: clear to auscultation bilaterally Heart: regular rate and rhythm, S1, S2 normal, no murmur, click, rub or gallop Abdomen: soft, non-tender; bowel sounds normal; no masses,  no organomegaly Extremities: extremities normal, atraumatic, no cyanosis or edema Pulses: 2+ and symmetric Skin: Skin color, texture, turgor normal. No rashes or lesions or small pigmented nevi on right dorsal wrist but is somewhat irregular in shape but chronic and stable Lymph nodes: Cervical, supraclavicular, and axillary nodes normal. Neurologic: Alert and oriented X 3, normal strength and tone. Normal symmetric reflexes. Normal coordination and gait Psych: Mood stable, speech normal   Assessment/ Plan: Jodi Rios here for annual physical exam.   Annual physical exam  BMI 30.0-30.9,adult  Hypothyroidism due to acquired atrophy of thyroid  Mild intermittent asthma without complication  Declines shingles vaccination.  Otherwise up-to-date on all preventative health care.  We did receive the note from her OB/GYN but never received the result of her Pap smear.  Mammogram up-to-date.  Thyroid levels were normal.  Asthma is uncomplicated and she has an inhaler at home for as needed use.  She will have [employer] fax me the form that needs to be completed for her insurance  Handout on  healthy lifestyle choices, including diet (rich in fruits, vegetables and lean meats and low in salt and simple carbohydrates) and exercise (at least 30 minutes of moderate physical activity daily).  Patient to follow up in 1 year for annual exam or sooner if needed.  Jeison Delpilar M. 08-26-1996, DO

## 2020-12-14 ENCOUNTER — Ambulatory Visit: Payer: BC Managed Care – PPO | Admitting: Family Medicine

## 2020-12-14 ENCOUNTER — Ambulatory Visit (INDEPENDENT_AMBULATORY_CARE_PROVIDER_SITE_OTHER): Payer: BC Managed Care – PPO

## 2020-12-14 ENCOUNTER — Encounter: Payer: Self-pay | Admitting: Family Medicine

## 2020-12-14 ENCOUNTER — Other Ambulatory Visit: Payer: Self-pay

## 2020-12-14 VITALS — BP 124/80 | HR 100 | Temp 98.2°F | Ht 62.0 in | Wt 167.4 lb

## 2020-12-14 DIAGNOSIS — J4540 Moderate persistent asthma, uncomplicated: Secondary | ICD-10-CM | POA: Diagnosis not present

## 2020-12-14 DIAGNOSIS — R051 Acute cough: Secondary | ICD-10-CM

## 2020-12-14 DIAGNOSIS — R0600 Dyspnea, unspecified: Secondary | ICD-10-CM

## 2020-12-14 DIAGNOSIS — R062 Wheezing: Secondary | ICD-10-CM

## 2020-12-14 DIAGNOSIS — R0602 Shortness of breath: Secondary | ICD-10-CM | POA: Diagnosis not present

## 2020-12-14 DIAGNOSIS — R059 Cough, unspecified: Secondary | ICD-10-CM | POA: Diagnosis not present

## 2020-12-14 MED ORDER — PREDNISONE 20 MG PO TABS: 40.0000 mg | ORAL_TABLET | Freq: Every day | ORAL | 0 refills | Status: AC

## 2020-12-14 MED ORDER — CLINDAMYCIN HCL 300 MG PO CAPS: 300.0000 mg | ORAL_CAPSULE | Freq: Three times a day (TID) | ORAL | 0 refills | Status: AC

## 2020-12-14 NOTE — Patient Instructions (Signed)
Aspiration Pneumonia, Adult Aspiration pneumonia is an infection that occurs after lung (pulmonary) aspiration. Pulmonary aspiration is when you inhale a large amount of food, liquid, stomach acid, or saliva into the lungs. This can cause inflammation and infection in the lungs. This can make you cough and make it hard to breathe. Aspiration pneumonia is a serious condition and can be life-threatening. What are the causes? This condition may be caused by: Bacteria in food, liquid, stomach acid, or saliva that is inhaled into the lung. Irritation and inflammation that results from material, such as blood or a foreign body, being inhaled into the lung. This can lead to an infection even though the material is not originally contaminated with bacteria. What increases the risk? You are more likely to get aspiration pneumonia if you have a condition that makes it hard to breathe, swallow, cough, or gag. These conditions may include: A breathing disorder, such as chronic obstructive pulmonary disease, that makes it hard to eat or drink while breathing. A brain (neurologic) disorder, such as stroke, seizures, Parkinson's disease, dementia, amyotrophic lateral sclerosis (ALS), or brain injury. Having gastroesophageal reflux disease (GERD). Having a weak disease-fighting system (immune system). Having a narrowing of the tube that carries food to the stomach (esophageal narrowing). Other factors that may make you more likely to get aspiration pneumonia include: Being older than age 60 and frail. Being given a general anesthetic for procedures. Drinking too much alcohol and passing out. If you pass out and vomit, then vomit can be inhaled into your lungs. Taking certain medicines, such as tranquilizers or sedatives. Taking poor care of your mouth and teeth. Being malnourished. What are the signs or symptoms? The main symptom of pulmonary aspiration may be an episode of choking or coughing while eating or  drinking. When aspiration pneumonia develops, symptoms include: Persistent cough. Difficulty breathing, such as wheezing or shortness of breath. Fever. Chest pain. Being more tired than usual (fatigue). Pulmonary aspiration may be silent, meaning that it is not associated with coughing or choking while eating or drinking. How is this diagnosed? This condition may be diagnosed based on: A physical exam. Tests, such as: A chest X-ray. A sputum culture. Saliva and mucus (sputum) are collected from the lungs or the tubes that carry air to the lungs (bronchi). The sputum is then tested for bacteria. Oximetry. A sensor or clip is placed on areas such as a finger, earlobe, or toe to measure the oxygen level in your blood. Blood tests. A swallowing study. This test looks at how food is swallowed and whether it goes into your windpipe (trachea) or esophagus. A bronchoscopy. This test uses a flexible tube (bronchoscope) to see inside the lungs. How is this treated? This condition may be treated with: Medicines. Antibiotic medicine will be given to kill the pneumonia bacteria. Other medicines may also be used to reduce fever, pain, or inflammation. Breathing assistance and oxygen therapy. Depending on how well you are breathing, you may need to be given oxygen, or you may need breathing support from a breathing machine (ventilator). Thoracentesis. This is a procedure to remove fluid that has built up in the space between the linings of the chest wall and the lungs. Dietary changes. You may need to avoid certain food textures or liquids. For people who have recurrent aspiration pneumonia, a feeding tube might be placed in the stomach for nutrition. Follow these instructions at home: Medicines  Take over-the-counter and prescription medicines only as told by your health care provider.   If you were prescribed an antibiotic medicine, take it as told by your health care provider. Do not stop taking the  antibiotic even if you start to feel better. Take cough medicine only if you are losing sleep. Cough medicine can prevent your body's natural ability to remove mucus from your lungs. General instructions  Carefully follow any eating instructions you were given, such as avoiding certain food textures or thickening your liquids. Thickening liquids reduces the risk of developing aspiration pneumonia again. Return to normal activities as told by your health care provider. Ask your health care provider what activities are safe for you. Sleep in a semi-upright position at night. Try to sleep in a reclining chair, or place a few pillows under your head in bed. Do not use any products that contain nicotine or tobacco, such as cigarettes, e-cigarettes, and chewing tobacco. If you need help quitting, ask your health care provider. Keep all follow-up visits as told by your health care provider. This is important. Contact a health care provider if you: Have a fever. Are coughing or choking while eating or drinking. Continue to have signs or symptoms of aspiration pneumonia. Get help right away if you have: Worsening shortness of breath, wheezing, or difficulty breathing. Chest pain. Summary Aspiration pneumonia is an infection that occurs after lung (pulmonary) aspiration. Pulmonary aspiration is when you inhale a large amount of food, liquid, stomach acid, or saliva into the lungs. The main symptom of pulmonary aspiration may be an episode of choking or coughing. It may also be silent without coughing or choking. You are more likely to get aspiration pneumonia if you have a condition that makes it hard to breathe, swallow, cough, or gag. This information is not intended to replace advice given to you by your health care provider. Make sure you discuss any questions you have with your health care provider. Document Revised: 02/13/2019 Document Reviewed: 02/13/2019 Elsevier Patient Education  2022 Elsevier  Inc.  

## 2020-12-14 NOTE — Progress Notes (Signed)
Established Patient Office Visit  Subjective:  Patient ID: Jodi Rios, female    DOB: [birth year]  Age: 60 y.o. MRN: [id number]  CC:  Chief Complaint  Patient presents with   Cough   Wheezing    HPI Jodi Rios presents for wheezing and chest tightness. This started in the middle of the night after a vomiting episode. She feels like she can't get a good breath. She also reports a headache, chest congestion, and nausea. She has not used her albuterol inhaler. She denies body aches, chills, fever, diarrhea, or vomiting. She has not tried any remedies.   Past Medical History:  Diagnosis Date   Anemia    GERD (gastroesophageal reflux disease)    Mitral valve prolapse 1990   Thyroid disease     Past Surgical History:  Procedure Laterality Date   CESAREAN SECTION     CHOLECYSTECTOMY      Family History  Problem Relation Age of Onset   Stroke Mother    Thyroid disease Mother    Hyperlipidemia Father    Diabetes Son    Cervical cancer Sister    Multiple sclerosis Sister     Social History   Socioeconomic History   Marital status: Divorced    Spouse name: Not on file   Number of children: 2   Years of education: Not on file   Highest education level: Not on file  Occupational History   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  She has 2 sons.  One son resides with her.  She has 3 grandchildren and 2 step grandchildren.   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   Social Determinants of Health   Financial Resource Strain: Not on file  Food Insecurity: Not on file  Transportation Needs: Not on  file  Physical Activity: Not on file  Stress: Not on file  Social Connections: Not on file  Intimate Partner Violence: Not on file    Outpatient Medications Prior to Visit  Medication Sig Dispense Refill   albuterol (VENTOLIN HFA) 108 (90 Base) MCG/ACT inhaler Inhale 1-2 puffs into the lungs every 6 (six) hours as needed for wheezing or shortness of breath. 8 g 3   ascorbic Acid (VITAMIN C) 500 MG CPCR Take by mouth.     cetirizine (ZYRTEC) 10 MG tablet Take 1 tablet (10 mg total) by mouth daily. 90 tablet 1   Cholecalciferol (VITAMIN D3) 10 MCG (400 UNIT) CHEW Chew by mouth.     levothyroxine (SYNTHROID) 25 MCG tablet Take 1 tablet (25 mcg total) by mouth daily. 90 tablet 3   montelukast (SINGULAIR) 10 MG tablet Take 1 tablet (10 mg total) by mouth at bedtime. 90 tablet 3   Multiple Vitamin (MULTI-VITAMIN) tablet Take by mouth.     omeprazole (PRILOSEC) 20 MG capsule Take 1 capsule (20 mg total) by mouth daily. 90 capsule 3   verapamil (CALAN) 80 MG tablet Take 1 tablet (80 mg total) by mouth daily. 90 tablet 3   fluticasone (FLONASE) 50 MCG/ACT nasal spray  Place 2 sprays into both nostrils daily. (Patient not taking: Reported on 12/14/2020) 11.1 mL 6   No facility-administered medications prior to visit.    Allergies  Allergen Reactions   Codeine Other (See Comments)   Nitrofurantoin Other (See Comments)   Ofloxacin Other (See Comments)   Penicillins Other (See Comments)   Sulfamethoxazole Other (See Comments)    ROS Review of Systems As per HPI.    Objective:    Physical Exam Vitals and nursing note reviewed.  Constitutional:      General: She is not in acute distress.    Appearance: She is not ill-appearing, toxic-appearing or diaphoretic.  HENT:     Head: Normocephalic and atraumatic.  Cardiovascular:     Rate and Rhythm: Normal rate and regular rhythm.     Heart sounds: Normal heart sounds. No murmur heard. Pulmonary:     Effort: Pulmonary effort is normal. No  respiratory distress.     Breath sounds: No stridor or decreased air movement. Examination of the right-upper field reveals wheezing. Examination of the left-upper field reveals wheezing. Examination of the right-middle field reveals wheezing. Examination of the left-middle field reveals wheezing. Examination of the right-lower field reveals wheezing. Examination of the left-lower field reveals wheezing. Wheezing present. No decreased breath sounds, rhonchi or rales.  Musculoskeletal:     Right lower leg: No edema.     Left lower leg: No edema.  Skin:    General: Skin is warm and dry.  Neurological:     Mental Status: She is alert and oriented to person, place, and time.  Psychiatric:        Mood and Affect: Mood normal.        Behavior: Behavior normal.    BP 124/80   Pulse 100   Temp 98.2 F (36.8 C) (Temporal)   Ht $R'5\' 2"'fF$  (1.575 m)   Wt 167 lb 6 oz (75.9 kg)   SpO2 93%   BMI 30.61 kg/m  Wt Readings from Last 3 Encounters:  12/14/20 167 lb 6 oz (75.9 kg)  10/19/20 168 lb 6.4 oz (76.4 kg)  05/02/20 167 lb (75.8 kg)     Health Maintenance Due  Topic Date Due   Pneumococcal Vaccine 10-42 Years old (1 - PCV) Never done    There are no preventive care reminders to display for this patient.  Lab Results  Component Value Date   TSH 2.540 09/29/2020   Lab Results  Component Value Date   WBC 6.7 09/29/2020   HGB 14.2 09/29/2020   HCT 42.9 09/29/2020   MCV 87 09/29/2020   PLT 293 09/29/2020   Lab Results  Component Value Date   NA 143 09/29/2020   K 4.2 09/29/2020   CO2 22 09/29/2020   GLUCOSE 86 09/29/2020   BUN 15 09/29/2020   CREATININE 0.71 09/29/2020   BILITOT 0.4 09/29/2020   ALKPHOS 136 (H) 09/29/2020   AST 24 09/29/2020   ALT 25 09/29/2020   PROT 6.1 09/29/2020   ALBUMIN 3.7 (L) 09/29/2020   CALCIUM 8.9 09/29/2020   EGFR 97 09/29/2020   Lab Results  Component Value Date   CHOL 138 09/29/2020   Lab Results  Component Value Date   HDL 58 09/29/2020    Lab Results  Component Value Date   LDLCALC 66 09/29/2020   Lab Results  Component Value Date   TRIG 70 09/29/2020   Lab Results  Component Value Date   CHOLHDL 2.4 09/29/2020   No results found  for: HGBA1C    Assessment & Plan:   Jodi Rios was seen today for cough and wheezing.  Diagnoses and all orders for this visit:  Dyspnea, unspecified type Acute cough Wheezing Moderate persistent asthma, unspecified whether complicated Chest tightness and wheezing after vomiting episode overnight. CXR shows mild linear scarring vs atelectasis. Will treat with clindamycin for possible aspiration pneumonia. Prednisone burst given history of asthma. Use albuterol prn.  -     DG Chest 2 View; Future -     clindamycin (CLEOCIN) 300 MG capsule; Take 1 capsule (300 mg total) by mouth 3 (three) times daily for 7 days. -     predniSONE (DELTASONE) 20 MG tablet; Take 2 tablets (40 mg total) by mouth daily with breakfast for 3 days.   Follow-up: Return if symptoms worsen or fail to improve.   The patient indicates understanding of these issues and agrees with the plan.  Gwenlyn Perking, FNP

## 2020-12-19 ENCOUNTER — Other Ambulatory Visit: Payer: Self-pay

## 2020-12-19 ENCOUNTER — Encounter: Payer: Self-pay | Admitting: Nurse Practitioner

## 2020-12-19 ENCOUNTER — Ambulatory Visit: Payer: BC Managed Care – PPO | Admitting: Nurse Practitioner

## 2020-12-19 VITALS — BP 132/90 | HR 86 | Temp 97.8°F | Resp 20 | Ht 62.0 in | Wt 165.0 lb

## 2020-12-19 DIAGNOSIS — R0989 Other specified symptoms and signs involving the circulatory and respiratory systems: Secondary | ICD-10-CM | POA: Diagnosis not present

## 2020-12-19 NOTE — Progress Notes (Signed)
   Subjective:    Patient ID: Jodi Rios, female    DOB: 02-Jan-1961, 60 y.o.   MRN: [id number]   Chief Complaint: chest congestion   HPI Patient come sin today wanting to make sure that her chest congestion is better. Hs e was seen on Wednesday after being awoken with acid reflux. They said she aspirated during that episode. Wants to make sure she is better. Sh ewas given clindamycin and prednisone. She still feels that she has congestion.    Review of Systems  Constitutional:  Negative for diaphoresis.  Eyes:  Negative for pain.  Respiratory:  Positive for cough. Negative for shortness of breath and wheezing.   Cardiovascular:  Negative for chest pain, palpitations and leg swelling.  Gastrointestinal:  Negative for abdominal pain.  Endocrine: Negative for polydipsia.  Skin:  Negative for rash.  Neurological:  Negative for dizziness, weakness and headaches.  Hematological:  Does not bruise/bleed easily.  All other systems reviewed and are negative.     Objective:   Physical Exam Vitals reviewed.  Constitutional:      Appearance: Normal appearance. She is obese.  Cardiovascular:     Rate and Rhythm: Normal rate and regular rhythm.     Heart sounds: Normal heart sounds.  Pulmonary:     Effort: Pulmonary effort is normal. No respiratory distress.     Breath sounds: Normal breath sounds. No wheezing or rales.  Skin:    General: Skin is warm.  Neurological:     General: No focal deficit present.     Mental Status: She is alert and oriented to person, place, and time.  Psychiatric:        Mood and Affect: Mood normal.        Behavior: Behavior normal.    BP 132/90   Pulse 86   Temp 97.8 F (36.6 C) (Temporal)   Resp 20   Ht 5\' 2"  (1.575 m)   Wt 165 lb (74.8 kg)   SpO2 97%   BMI 30.18 kg/m        Assessment & Plan:  Jodi Rios in today with chief complaint of chest congestion   1. Chest congestion Mucinex OTC Force fluids RTO prn    The above  assessment and management plan was discussed with the patient. The patient verbalized understanding of and has agreed to the management plan. Patient is aware to call the clinic if symptoms persist or worsen. Patient is aware when to return to the clinic for a follow-up visit. Patient educated on when it is appropriate to go to the emergency department.   Mary-Margaret Linden Dolin, FNP

## 2020-12-19 NOTE — Patient Instructions (Signed)
Guaifenesin Solution What is this medication? GUAIFENESIN (gwye FEN e sin) treats cough and chest congestion. It works by thinning and loosening mucus, making it easier to clear from the head, throat, and lungs. It belongs to a group of medications called expectorants. This medicine may be used for other purposes; ask your health care provider or pharmacist if you have questions. COMMON BRAND NAME(S): Altarussin, Altorant, Chest Congestion Relief, Cough, Diabetic Tussin, Diabetic Tussin EX, Diabetic Tussin Mucus Relief, ElixSure EX, Ganidin NR, GERI-TUSSIN, Guiatuss, Iophen-NR, Miltuss EX, Mucinex Children's, Mucinex Fast-Max Chest Congestion, Mucus + Chest Congestion, Mucus Relief Children's, Naldecon, Organidin NR, Q-Tussin, Robafen, Robafen Congestion, Robitussin, Robitussin Mucus + Chest Congestion, Scot-Tussin Expectorant, Siltussin DAS, Siltussin Diabetic DAS-Na, Siltussin SA, TUSNEL-EX What should I tell my care team before I take this medication? They need to know if you have any of these conditions: Diabetes Fever Kidney disease An unusual or allergic reaction to guaifenesin, other medications, foods, dyes, or preservatives Pregnant or trying to get pregnant Breast-feeding How should I use this medication? Take this medication by mouth. Follow the directions on the prescription label. Use a specially marked spoon or container to measure your dose. Household spoons are not accurate. Take your medication at regular intervals. Do not take it more often than directed. Talk to your care team about the use of this medication in children. Special care may be needed. Overdosage: If you think you have taken too much of this medicine contact a poison control center or emergency room at once. NOTE: This medicine is only for you. Do not share this medicine with others. What if I miss a dose? If you miss a dose, take it as soon as you can. If it is almost time for your next dose, take only that dose.  Do not take double or extra doses. What may interact with this medication? Interactions are not expected. This list may not describe all possible interactions. Give your health care provider a list of all the medicines, herbs, non-prescription drugs, or dietary supplements you use. Also tell them if you smoke, drink alcohol, or use illegal drugs. Some items may interact with your medicine. What should I watch for while using this medication? Do not treat a cough for more than 1 week without consulting your care team. If you also have a high fever, skin rash, continuing headache, or sore throat, see your care team. For best results, drink 6 to 8 glasses water daily while you are taking this medication. What side effects may I notice from receiving this medication? Side effects that you should report to your care team as soon as possible: Allergic reactions-skin rash, itching, hives, swelling of the face, lips, tongue, or throat Side effects that usually do not require medical attention (report to your care team if they continue or are bothersome): Dizziness Drowsiness Headache This list may not describe all possible side effects. Call your doctor for medical advice about side effects. You may report side effects to FDA at 1-800-FDA-1088. Where should I keep my medication? Keep out of the reach of children. Store at room temperature between 20 and 25 degrees C (68 and 77 degrees F). Do not freeze. Keep container tightly closed. Throw away any unused medication after the expiration date. NOTE: This sheet is a summary. It may not cover all possible information. If you have questions about this medicine, talk to your doctor, pharmacist, or health care provider.  2022 Elsevier/Gold Standard (2020-04-06 15:33:50)

## 2020-12-28 ENCOUNTER — Telehealth: Payer: BC Managed Care – PPO | Admitting: Family Medicine

## 2020-12-28 ENCOUNTER — Telehealth: Payer: Self-pay | Admitting: Family Medicine

## 2020-12-28 DIAGNOSIS — J690 Pneumonitis due to inhalation of food and vomit: Secondary | ICD-10-CM

## 2020-12-28 MED ORDER — PROMETHAZINE-DM 6.25-15 MG/5ML PO SYRP: 2.5000 mL | ORAL_SOLUTION | Freq: Four times a day (QID) | ORAL | 0 refills | Status: DC | PRN

## 2020-12-28 MED ORDER — AZITHROMYCIN 250 MG PO TABS: ORAL_TABLET | ORAL | 0 refills | Status: DC

## 2020-12-28 NOTE — Telephone Encounter (Signed)
Not at this time.

## 2020-12-28 NOTE — Telephone Encounter (Signed)
Patient needs appt

## 2020-12-28 NOTE — Telephone Encounter (Signed)
Ok with me.  I spoke to Ms Zulauf today and apparently, she had been initially referred to Dr Darlyn Read, hence the request.  No concerns about medical car, just wants to see who her family/ friends see

## 2020-12-28 NOTE — Progress Notes (Signed)
Phone visit  Subjective: GG:YIRSW PCP: Raliegh Ip, DO NIO:EVOJJ Jodi Rios is a 60 y.o. female. Patient provides verbal consent for consult held via phone.  Due to COVID-19 pandemic this visit was conducted virtually. This visit type was conducted due to national recommendations for restrictions regarding the COVID-19 Pandemic (e.g. social distancing, sheltering in place) in an effort to limit this patient's exposure and mitigate transmission in our community. All issues noted in this document were discussed and addressed.  A physical exam was not performed with this format.   Location of patient: car Location of provider: [facility] Others present for call: none  1.  Aspiration pneumonia Patient was treated for aspiration pneumonia with clindamycin recently.  She notes that typically when she gets any type of lung infection she "needs 2 rounds of antibiotics for things to get better".  She reports ongoing cough, wheezing.  She is been utilizing her inhaler with no significant improvement in symptoms.  She has been using Mucinex but again still has persistent cough and chest congestion   ROS: Per HPI  Allergies  Allergen Reactions   Codeine Other (See Comments)   Nitrofurantoin Other (See Comments)   Ofloxacin Other (See Comments)   Penicillins Other (See Comments)   Sulfamethoxazole Other (See Comments)   Past Medical History:  Diagnosis Date   Anemia    GERD (gastroesophageal reflux disease)    Mitral valve prolapse 1990   Thyroid disease     Current Outpatient Medications:    albuterol (VENTOLIN HFA) 108 (90 Base) MCG/ACT inhaler, Inhale 1-2 puffs into the lungs every 6 (six) hours as needed for wheezing or shortness of breath., Disp: 8 g, Rfl: 3   ascorbic Acid (VITAMIN C) 500 MG CPCR, Take by mouth., Disp: , Rfl:    cetirizine (ZYRTEC) 10 MG tablet, Take 1 tablet (10 mg total) by mouth daily., Disp: 90 tablet, Rfl: 1   Cholecalciferol (VITAMIN D3) 10 MCG (400 UNIT) CHEW,  Chew by mouth., Disp: , Rfl:    fluticasone (FLONASE) 50 MCG/ACT nasal spray, Place 2 sprays into both nostrils daily. (Patient not taking: Reported on 12/19/2020), Disp: 11.1 mL, Rfl: 6   levothyroxine (SYNTHROID) 25 MCG tablet, Take 1 tablet (25 mcg total) by mouth daily., Disp: 90 tablet, Rfl: 3   montelukast (SINGULAIR) 10 MG tablet, Take 1 tablet (10 mg total) by mouth at bedtime., Disp: 90 tablet, Rfl: 3   Multiple Vitamin (MULTI-VITAMIN) tablet, Take by mouth., Disp: , Rfl:    omeprazole (PRILOSEC) 20 MG capsule, Take 1 capsule (20 mg total) by mouth daily., Disp: 90 capsule, Rfl: 3   verapamil (CALAN) 80 MG tablet, Take 1 tablet (80 mg total) by mouth daily., Disp: 90 tablet, Rfl: 3  Pulm: normal work of breathing, no coughing/ wheezing heard during conversation   Assessment/ Plan: 60 y.o. female   Aspiration pneumonia due to regurgitated food, unspecified laterality, unspecified part of lung (HCC) - Plan: azithromycin (ZITHROMAX) 250 MG tablet, promethazine-dextromethorphan (PROMETHAZINE-DM) 6.25-15 MG/5ML syrup  Sound like she has risk for aspiration pneumonia.  Ongoing to treat her with azithromycin given ongoing symptoms and I have sent in promethazine with dextromethorphan for symptomatic relief.  She understands red flags and symptoms warranting further evaluation.  She will follow.  Start time: 5:15pm End time: 5:20pm  Total time spent on patient care (including video visit/ documentation): 5 minutes  Jevante Hollibaugh Hulen Skains, DO [facility] [phone]

## 2020-12-29 NOTE — Telephone Encounter (Signed)
okay

## 2020-12-29 NOTE — Telephone Encounter (Signed)
Please advise if patient can be switched ?  These are two different responses in this encounter.

## 2020-12-30 NOTE — Telephone Encounter (Signed)
Lm patient can chenge to Dr. Darlyn Read. Please schedule patient appt with him.

## 2020-12-30 NOTE — Telephone Encounter (Signed)
Lmtcb across 11/04

## 2021-02-11 ENCOUNTER — Other Ambulatory Visit: Payer: Self-pay | Admitting: Family Medicine

## 2021-03-31 DIAGNOSIS — H6123 Impacted cerumen, bilateral: Secondary | ICD-10-CM | POA: Diagnosis not present

## 2021-03-31 DIAGNOSIS — J069 Acute upper respiratory infection, unspecified: Secondary | ICD-10-CM | POA: Diagnosis not present

## 2021-03-31 DIAGNOSIS — H6591 Unspecified nonsuppurative otitis media, right ear: Secondary | ICD-10-CM | POA: Diagnosis not present

## 2021-03-31 DIAGNOSIS — Z20818 Contact with and (suspected) exposure to other bacterial communicable diseases: Secondary | ICD-10-CM | POA: Diagnosis not present

## 2021-04-25 ENCOUNTER — Encounter: Payer: Self-pay | Admitting: Nurse Practitioner

## 2021-04-25 ENCOUNTER — Ambulatory Visit: Payer: BC Managed Care – PPO | Admitting: Nurse Practitioner

## 2021-04-25 VITALS — BP 111/75 | HR 87 | Temp 98.7°F | Ht 62.0 in | Wt 171.0 lb

## 2021-04-25 DIAGNOSIS — J0110 Acute frontal sinusitis, unspecified: Secondary | ICD-10-CM

## 2021-04-25 DIAGNOSIS — R051 Acute cough: Secondary | ICD-10-CM | POA: Diagnosis not present

## 2021-04-25 MED ORDER — DOXYCYCLINE HYCLATE 100 MG PO TABS: 100.0000 mg | ORAL_TABLET | Freq: Two times a day (BID) | ORAL | 0 refills | Status: DC

## 2021-04-25 NOTE — Patient Instructions (Signed)

## 2021-04-25 NOTE — Progress Notes (Signed)
Acute Office Visit  Subjective:    Patient ID: Jodi Rios, female    DOB: [birth year], 61 y.o.   MRN: [id number]  Chief Complaint  Patient presents with   Sinus Problem    Ear pain    Sinus Problem This is a recurrent problem. The current episode started in the past 7 days. The problem is unchanged. There has been no fever. The pain is moderate. Associated symptoms include congestion, headaches and sinus pressure. Past treatments include nothing.  URI  This is a recurrent problem. The current episode started in the past 7 days. The problem has been unchanged. There has been no fever. Associated symptoms include congestion and headaches. Pertinent negatives include no abdominal pain or chest pain. She has tried nothing for the symptoms.    Past Medical History:  Diagnosis Date   Anemia    GERD (gastroesophageal reflux disease)    Mitral valve prolapse 1990   Thyroid disease     Past Surgical History:  Procedure Laterality Date   CESAREAN SECTION     CHOLECYSTECTOMY      Family History  Problem Relation Age of Onset   Stroke Mother    Thyroid disease Mother    Hyperlipidemia Father    Diabetes Son    Cervical cancer Sister    Multiple sclerosis Sister     Social History   Socioeconomic History   Marital status: Divorced    Spouse name: Not on file   Number of children: 2   Years of education: Not on file   Highest education level: Not on file  Occupational History   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  She has 2 sons.  One son resides with her.  She has 3 grandchildren and 2 step grandchildren.   She works at  [employer] on second shift on the [employer] line.   Social Determinants of Health   Financial Resource Strain: Not on file  Food Insecurity: Not on file  Transportation Needs: Not on file  Physical Activity: Not on file  Stress: Not on file  Social Connections: Not on file  Intimate Partner Violence: Not on file    Outpatient Medications Prior to Visit  Medication Sig Dispense Refill   albuterol (PROVENTIL) (2.5 MG/3ML) 0.083% nebulizer solution 2.5 mg every four (4) hours as needed.     albuterol (VENTOLIN HFA) 108 (90 Base) MCG/ACT inhaler Inhale 1-2 puffs into the lungs every 6 (six) hours as needed for wheezing or shortness of breath. 8 g 3   ascorbic Acid (VITAMIN C) 500 MG CPCR Take by mouth.     budesonide-formoterol (SYMBICORT) 160-4.5 MCG/ACT inhaler Inhale into the lungs.     cetirizine (ZYRTEC) 10 MG tablet Take 1 tablet (10 mg total) by mouth daily. 90 tablet 1   Cholecalciferol 25 MCG (1000 UT) tablet Take by mouth.     fluticasone (FLONASE) 50 MCG/ACT nasal spray Place 2 sprays into both nostrils daily. 11.1 mL 6   Fluticasone-Umeclidin-Vilant (TRELEGY ELLIPTA) 200-62.5-25 MCG/ACT AEPB Inhale into the lungs.     levothyroxine (  SYNTHROID) 25 MCG tablet Take 1 tablet by mouth daily.     montelukast (SINGULAIR) 10 MG tablet Take 1 tablet (10 mg total) by mouth at bedtime. 90 tablet 3   Multiple Vitamin (MULTI-VITAMIN) tablet Take by mouth.     omeprazole (PRILOSEC) 20 MG capsule Take 1 capsule (20 mg total) by mouth daily. 90 capsule 3   omeprazole (PRILOSEC) 20 MG capsule Take 1 capsule by mouth daily.     verapamil (CALAN) 80 MG tablet Take 1 tablet (80 mg total) by mouth daily. 90 tablet 2   azithromycin (ZITHROMAX) 250 MG tablet Take 2 tablets today, then take 1 tablet daily until gone. 6 tablet 0   Cholecalciferol (VITAMIN D3) 10 MCG (400 UNIT) CHEW Chew by mouth.     levothyroxine (SYNTHROID) 25 MCG tablet Take 1 tablet (25 mcg total) by mouth daily. 90 tablet 2    promethazine-dextromethorphan (PROMETHAZINE-DM) 6.25-15 MG/5ML syrup Take 2.5 mLs by mouth 4 (four) times daily as needed for cough. 118 mL 0   No facility-administered medications prior to visit.    Allergies  Allergen Reactions   Codeine Other (See Comments)   Nitrofurantoin Other (See Comments)   Ofloxacin Other (See Comments)   Penicillins Other (See Comments)   Sulfamethoxazole Other (See Comments)    Review of Systems  Constitutional: Negative.   HENT:  Positive for congestion and sinus pressure.   Eyes: Negative.   Cardiovascular:  Negative for chest pain.  Gastrointestinal: Negative.  Negative for abdominal pain.  Neurological:  Positive for headaches.  All other systems reviewed and are negative.     Objective:    Physical Exam Vitals and nursing note reviewed.  Constitutional:      Appearance: Normal appearance.  HENT:     Head: Normocephalic.     Right Ear: External ear normal. There is no impacted cerumen.     Left Ear: External ear normal. There is no impacted cerumen.     Nose: Congestion present.     Mouth/Throat:     Mouth: Mucous membranes are moist.     Pharynx: Oropharynx is clear.  Eyes:     Conjunctiva/sclera: Conjunctivae normal.  Cardiovascular:     Rate and Rhythm: Normal rate and regular rhythm.     Pulses: Normal pulses.     Heart sounds: Normal heart sounds.  Pulmonary:     Effort: Pulmonary effort is normal.     Breath sounds: Normal breath sounds.  Abdominal:     General: Bowel sounds are normal.  Skin:    General: Skin is warm.  Neurological:     General: No focal deficit present.     Mental Status: She is alert and oriented to person, place, and time.    BP 111/75    Pulse 87    Temp 98.7 F (37.1 C)    Ht $R'5\' 2"'MK$  (1.575 m)    Wt 171 lb (77.6 kg)    SpO2 95%    BMI 31.28 kg/m  Wt Readings from Last 3 Encounters:  04/25/21 171 lb (77.6 kg)  12/19/20 165 lb (74.8 kg)  12/14/20 167 lb 6 oz (75.9 kg)    Health Maintenance Due   Topic Date Due   Zoster Vaccines- Shingrix (1 of 2) Never done    There are no preventive care reminders to display for this patient.   Lab Results  Component Value Date   TSH 2.540 09/29/2020   Lab Results  Component Value Date   WBC  6.7 09/29/2020   HGB 14.2 09/29/2020   HCT 42.9 09/29/2020   MCV 87 09/29/2020   PLT 293 09/29/2020   Lab Results  Component Value Date   NA 143 09/29/2020   K 4.2 09/29/2020   CO2 22 09/29/2020   GLUCOSE 86 09/29/2020   BUN 15 09/29/2020   CREATININE 0.71 09/29/2020   BILITOT 0.4 09/29/2020   ALKPHOS 136 (H) 09/29/2020   AST 24 09/29/2020   ALT 25 09/29/2020   PROT 6.1 09/29/2020   ALBUMIN 3.7 (L) 09/29/2020   CALCIUM 8.9 09/29/2020   EGFR 97 09/29/2020   Lab Results  Component Value Date   CHOL 138 09/29/2020   Lab Results  Component Value Date   HDL 58 09/29/2020   Lab Results  Component Value Date   LDLCALC 66 09/29/2020   Lab Results  Component Value Date   TRIG 70 09/29/2020   Lab Results  Component Value Date   CHOLHDL 2.4 09/29/2020   No results found for: HGBA1C     Assessment & Plan:  Patient presents with sinusitis Take meds as prescribed - Use a cool mist humidifier  -Use saline nose sprays frequently -Force fluids -For fever or aches or pains- take Tylenol or ibuprofen. -Doxycycline 100 mg twice daily for 7 days -If symptoms do not improve, she may need to be COVID tested to rule this out Follow up with worsening unresolved symptoms  Problem List Items Addressed This Visit   None Visit Diagnoses     Subacute frontal sinusitis    -  Primary   Relevant Medications   doxycycline (VIBRA-TABS) 100 MG tablet   Acute cough            Meds ordered this encounter  Medications   doxycycline (VIBRA-TABS) 100 MG tablet    Sig: Take 1 tablet (100 mg total) by mouth 2 (two) times daily.    Dispense:  14 tablet    Refill:  0    Order Specific Question:   Supervising Provider    Answer:   Claretta Fraise [[id number]]     Ivy Lynn, NP

## 2021-05-13 ENCOUNTER — Other Ambulatory Visit: Payer: Self-pay | Admitting: Family Medicine

## 2021-05-13 DIAGNOSIS — J4520 Mild intermittent asthma, uncomplicated: Secondary | ICD-10-CM

## 2021-05-15 ENCOUNTER — Other Ambulatory Visit: Payer: Self-pay | Admitting: Family Medicine

## 2021-05-15 DIAGNOSIS — J4520 Mild intermittent asthma, uncomplicated: Secondary | ICD-10-CM

## 2021-08-12 ENCOUNTER — Other Ambulatory Visit: Payer: Self-pay | Admitting: Family Medicine

## 2021-08-31 DIAGNOSIS — R300 Dysuria: Secondary | ICD-10-CM | POA: Diagnosis not present

## 2021-08-31 DIAGNOSIS — N3001 Acute cystitis with hematuria: Secondary | ICD-10-CM | POA: Diagnosis not present

## 2021-08-31 DIAGNOSIS — Z6830 Body mass index (BMI) 30.0-30.9, adult: Secondary | ICD-10-CM | POA: Diagnosis not present

## 2021-10-19 ENCOUNTER — Other Ambulatory Visit: Payer: Self-pay | Admitting: *Deleted

## 2021-10-19 ENCOUNTER — Other Ambulatory Visit: Payer: BC Managed Care – PPO

## 2021-10-19 DIAGNOSIS — E034 Atrophy of thyroid (acquired): Secondary | ICD-10-CM

## 2021-10-19 DIAGNOSIS — Z0000 Encounter for general adult medical examination without abnormal findings: Secondary | ICD-10-CM

## 2021-10-20 ENCOUNTER — Encounter: Payer: BC Managed Care – PPO | Admitting: Family Medicine

## 2021-10-20 LAB — CBC WITH DIFFERENTIAL/PLATELET
Basophils Absolute: 0.1 10*3/uL (ref 0.0–0.2)
Basos: 1 %
EOS (ABSOLUTE): 0.2 10*3/uL (ref 0.0–0.4)
Eos: 3 %
Hematocrit: 42.2 % (ref 34.0–46.6)
Hemoglobin: 14.4 g/dL (ref 11.1–15.9)
Immature Grans (Abs): 0 10*3/uL (ref 0.0–0.1)
Immature Granulocytes: 0 %
Lymphocytes Absolute: 1.8 10*3/uL (ref 0.7–3.1)
Lymphs: 27 %
MCH: 30.2 pg (ref 26.6–33.0)
MCHC: 34.1 g/dL (ref 31.5–35.7)
MCV: 89 fL (ref 79–97)
Monocytes Absolute: 0.8 10*3/uL (ref 0.1–0.9)
Monocytes: 12 %
Neutrophils Absolute: 3.7 10*3/uL (ref 1.4–7.0)
Neutrophils: 57 %
Platelets: 327 10*3/uL (ref 150–450)
RBC: 4.77 x10E6/uL (ref 3.77–5.28)
RDW: 12 % (ref 11.7–15.4)
WBC: 6.5 10*3/uL (ref 3.4–10.8)

## 2021-10-20 LAB — CMP14+EGFR
ALT: 25 IU/L (ref 0–32)
AST: 23 IU/L (ref 0–40)
Albumin/Globulin Ratio: 2 (ref 1.2–2.2)
Albumin: 4 g/dL (ref 3.9–4.9)
Alkaline Phosphatase: 153 IU/L — ABNORMAL HIGH (ref 44–121)
BUN/Creatinine Ratio: 12 (ref 12–28)
BUN: 9 mg/dL (ref 8–27)
Bilirubin Total: 0.6 mg/dL (ref 0.0–1.2)
CO2: 26 mmol/L (ref 20–29)
Calcium: 9.3 mg/dL (ref 8.7–10.3)
Chloride: 105 mmol/L (ref 96–106)
Creatinine, Ser: 0.73 mg/dL (ref 0.57–1.00)
Globulin, Total: 2 g/dL (ref 1.5–4.5)
Glucose: 87 mg/dL (ref 70–99)
Potassium: 4.4 mmol/L (ref 3.5–5.2)
Sodium: 144 mmol/L (ref 134–144)
Total Protein: 6 g/dL (ref 6.0–8.5)
eGFR: 94 mL/min/{1.73_m2} (ref >59)

## 2021-10-20 LAB — LIPID PANEL
Chol/HDL Ratio: 2.4 ratio (ref 0.0–4.4)
Cholesterol, Total: 140 mg/dL (ref 100–199)
HDL: 59 mg/dL (ref >39)
LDL Chol Calc (NIH): 68 mg/dL (ref 0–99)
Triglycerides: 63 mg/dL (ref 0–149)
VLDL Cholesterol Cal: 13 mg/dL (ref 5–40)

## 2021-10-20 LAB — TSH+FREE T4
Free T4: 1.46 ng/dL (ref 0.82–1.77)
TSH: 3.05 u[IU]/mL (ref 0.450–4.500)

## 2021-10-23 ENCOUNTER — Ambulatory Visit (INDEPENDENT_AMBULATORY_CARE_PROVIDER_SITE_OTHER): Payer: BC Managed Care – PPO | Admitting: Family Medicine

## 2021-10-23 ENCOUNTER — Encounter: Payer: Self-pay | Admitting: Family Medicine

## 2021-10-23 VITALS — BP 133/86 | HR 85 | Temp 97.5°F | Ht 62.0 in | Wt 175.0 lb

## 2021-10-23 DIAGNOSIS — J4520 Mild intermittent asthma, uncomplicated: Secondary | ICD-10-CM

## 2021-10-23 DIAGNOSIS — Z0001 Encounter for general adult medical examination with abnormal findings: Secondary | ICD-10-CM

## 2021-10-23 DIAGNOSIS — E034 Atrophy of thyroid (acquired): Secondary | ICD-10-CM | POA: Diagnosis not present

## 2021-10-23 DIAGNOSIS — J4541 Moderate persistent asthma with (acute) exacerbation: Secondary | ICD-10-CM

## 2021-10-23 DIAGNOSIS — K219 Gastro-esophageal reflux disease without esophagitis: Secondary | ICD-10-CM

## 2021-10-23 DIAGNOSIS — Z0000 Encounter for general adult medical examination without abnormal findings: Secondary | ICD-10-CM

## 2021-10-23 MED ORDER — OMEPRAZOLE 20 MG PO CPDR
20.0000 mg | DELAYED_RELEASE_CAPSULE | Freq: Every day | ORAL | 3 refills | Status: DC
Start: 2021-10-23 — End: 2022-08-08

## 2021-10-23 MED ORDER — ALBUTEROL SULFATE (2.5 MG/3ML) 0.083% IN NEBU
INHALATION_SOLUTION | RESPIRATORY_TRACT | 11 refills | Status: AC
Start: 2021-10-23 — End: ?

## 2021-10-23 MED ORDER — LEVOTHYROXINE SODIUM 25 MCG PO TABS
25.0000 ug | ORAL_TABLET | Freq: Every day | ORAL | 1 refills | Status: DC
Start: 2021-10-23 — End: 2022-02-13

## 2021-10-23 MED ORDER — VERAPAMIL HCL 80 MG PO TABS
80.0000 mg | ORAL_TABLET | Freq: Every day | ORAL | 3 refills | Status: DC
Start: 2021-10-23 — End: 2022-10-25

## 2021-10-23 MED ORDER — FLUTICASONE PROPIONATE 50 MCG/ACT NA SUSP: 2.0000 | Freq: Every day | NASAL | 6 refills | Status: DC

## 2021-10-23 MED ORDER — ALBUTEROL SULFATE HFA 108 (90 BASE) MCG/ACT IN AERS: 1.0000 | INHALATION_SPRAY | Freq: Four times a day (QID) | RESPIRATORY_TRACT | 3 refills | Status: DC | PRN

## 2021-10-23 MED ORDER — CETIRIZINE HCL 10 MG PO TABS: 10.0000 mg | ORAL_TABLET | Freq: Every day | ORAL | 1 refills | Status: DC

## 2021-10-23 NOTE — Progress Notes (Signed)
Subjective:  Patient ID: Jodi Rios, female    DOB: September 05, 1960  Age: 61 y.o. MRN: [id number]  CC: Annual Exam   HPI Jodi Rios presents for annual exam without GYN  DCed maintenance inhalers. Using albuterol occasionally only. No longer taking the singulair. Denies significant asthma attack or dyspnea.   follow-up on  thyroid. The patient has a history of hypothyroidism for many years. It has been stable recently. Pt. denies any change in  voice, loss of hair, heat or cold intolerance. Energy level has been adequate to good. Patient denies constipation and diarrhea. No myxedema. Medication is as noted below. Verified that pt is taking it daily on an empty stomach. Well tolerated.  Patient in for follow-up of GERD. Currently asymptomatic taking  PPI daily. There is no chest pain or heartburn. No hematemesis and no melena. No dysphagia or choking. Onset is remote. Progression is stable. Complicating factors, none.      10/23/2021    2:01 PM 04/25/2021    3:36 PM 10/19/2020    8:10 AM  Depression screen PHQ 2/9  Decreased Interest 0 0 0  Down, Depressed, Hopeless 0 0 0  PHQ - 2 Score 0 0 0  Altered sleeping  0 0  Tired, decreased energy  0 0  Change in appetite  0 0  Feeling bad or failure about yourself   0 0  Trouble concentrating  0 0  Moving slowly or fidgety/restless  0 0  Suicidal thoughts  0 0  PHQ-9 Score  0 0  Difficult doing work/chores  Not difficult at all Not difficult at all    History Jodi Rios has a past medical history of Anemia, GERD (gastroesophageal reflux disease), Mitral valve prolapse (1990), and Thyroid disease.   She has a past surgical history that includes Cholecystectomy and Cesarean section.   Her family history includes Cervical cancer in her sister; Diabetes in her son; Hyperlipidemia in her father; Multiple sclerosis in her sister; Stroke in her mother; Thyroid disease in her mother.She reports that she has never smoked. She uses smokeless  tobacco. She reports that she does not currently use alcohol. She reports that she does not use drugs.    ROS Review of Systems  Constitutional:  Negative for appetite change, chills, diaphoresis, fatigue, fever and unexpected weight change.  HENT:  Negative for congestion, ear pain, hearing loss, postnasal drip, rhinorrhea, sneezing, sore throat and trouble swallowing.   Eyes:  Negative for pain.  Respiratory:  Negative for cough, chest tightness and shortness of breath.   Cardiovascular:  Negative for chest pain and palpitations.  Gastrointestinal:  Negative for abdominal pain, constipation, diarrhea, nausea and vomiting.  Endocrine: Negative for cold intolerance, heat intolerance, polydipsia, polyphagia and polyuria.  Genitourinary:  Negative for dysuria, frequency and menstrual problem.  Musculoskeletal:  Negative for arthralgias and joint swelling.  Skin:  Negative for rash.  Allergic/Immunologic: Negative for environmental allergies.  Neurological:  Negative for dizziness, weakness, numbness and headaches.  Psychiatric/Behavioral:  Negative for agitation and dysphoric mood.     Objective:  BP 133/86   Pulse 85   Temp (!) 97.5 F (36.4 C)   Ht 5\' 2"  (1.575 m)   Wt 175 lb (79.4 kg)   SpO2 95%   BMI 32.01 kg/m   BP Readings from Last 3 Encounters:  10/23/21 133/86  04/25/21 111/75  12/19/20 132/90    Wt Readings from Last 3 Encounters:  10/23/21 175 lb (79.4 kg)  04/25/21 171 lb (  77.6 kg)  12/19/20 165 lb (74.8 kg)     Physical Exam Constitutional:      General: She is not in acute distress.    Appearance: She is well-developed.  Cardiovascular:     Rate and Rhythm: Normal rate and regular rhythm.  Pulmonary:     Breath sounds: Normal breath sounds.  Musculoskeletal:        General: Normal range of motion.  Skin:    General: Skin is warm and dry.  Neurological:     Mental Status: She is alert and oriented to person, place, and time.       Assessment  & Plan:   Jodi Rios was seen today for annual exam.  Diagnoses and all orders for this visit:  Well adult exam  Mild intermittent asthma without complication -     cetirizine (ZYRTEC) 10 MG tablet; Take 1 tablet (10 mg total) by mouth daily. -     fluticasone (FLONASE) 50 MCG/ACT nasal spray; Place 2 sprays into both nostrils daily.  Moderate persistent asthma with acute exacerbation -     albuterol (VENTOLIN HFA) 108 (90 Base) MCG/ACT inhaler; Inhale 1-2 puffs into the lungs every 6 (six) hours as needed for wheezing or shortness of breath.  Hypothyroidism due to acquired atrophy of thyroid  Gastroesophageal reflux disease without esophagitis  Other orders -     omeprazole (PRILOSEC) 20 MG capsule; Take 1 capsule (20 mg total) by mouth daily. -     verapamil (CALAN) 80 MG tablet; Take 1 tablet (80 mg total) by mouth daily. -     albuterol (PROVENTIL) (2.5 MG/3ML) 0.083% nebulizer solution; 2.5 mg every four (4) hours as needed. -     levothyroxine (SYNTHROID) 25 MCG tablet; Take 1 tablet (25 mcg total) by mouth daily.       I have discontinued Jodi Rios "Sue"'s montelukast, budesonide-formoterol, Trelegy Ellipta, and doxycycline. I have also changed her levothyroxine. Additionally, I am having her maintain her Multi-Vitamin, ascorbic Acid, Cholecalciferol, omeprazole, verapamil, albuterol, albuterol, cetirizine, and fluticasone.  Allergies as of 10/23/2021       Reactions   Codeine Other (See Comments)   Nitrofurantoin Other (See Comments)   Ofloxacin Other (See Comments)   Penicillins Other (See Comments)   Sulfamethoxazole Other (See Comments)        Medication List        Accurate as of October 23, 2021 11:59 PM. If you have any questions, ask your nurse or doctor.          STOP taking these medications    budesonide-formoterol 160-4.5 MCG/ACT inhaler Commonly known as: SYMBICORT Stopped by: Mechele Claude, MD   doxycycline 100 MG tablet Commonly known  as: VIBRA-TABS Stopped by: Mechele Claude, MD   montelukast 10 MG tablet Commonly known as: SINGULAIR Stopped by: Mechele Claude, MD   Trelegy Salomon Mast 200-62.5-25 MCG/ACT Aepb Generic drug: Fluticasone-Umeclidin-Vilant Stopped by: Mechele Claude, MD       TAKE these medications    albuterol 108 (90 Base) MCG/ACT inhaler Commonly known as: VENTOLIN HFA Inhale 1-2 puffs into the lungs every 6 (six) hours as needed for wheezing or shortness of breath.   albuterol (2.5 MG/3ML) 0.083% nebulizer solution Commonly known as: PROVENTIL 2.5 mg every four (4) hours as needed.   ascorbic Acid 500 MG Cpcr Commonly known as: VITAMIN C Take by mouth.   cetirizine 10 MG tablet Commonly known as: ZYRTEC Take 1 tablet (10 mg total) by mouth daily.   Cholecalciferol  25 MCG (1000 UT) tablet Take by mouth.   fluticasone 50 MCG/ACT nasal spray Commonly known as: FLONASE Place 2 sprays into both nostrils daily.   levothyroxine 25 MCG tablet Commonly known as: SYNTHROID Take 1 tablet (25 mcg total) by mouth daily.   Multi-Vitamin tablet Take by mouth.   omeprazole 20 MG capsule Commonly known as: PRILOSEC Take 1 capsule (20 mg total) by mouth daily.   verapamil 80 MG tablet Commonly known as: CALAN Take 1 tablet (80 mg total) by mouth daily.         Follow-up: Return in about 6 months (around 04/25/2022).  Mechele Claude, M.D.

## 2021-11-09 DIAGNOSIS — Z6832 Body mass index (BMI) 32.0-32.9, adult: Secondary | ICD-10-CM | POA: Diagnosis not present

## 2021-11-09 DIAGNOSIS — J0100 Acute maxillary sinusitis, unspecified: Secondary | ICD-10-CM | POA: Diagnosis not present

## 2021-11-17 DIAGNOSIS — Z01419 Encounter for gynecological examination (general) (routine) without abnormal findings: Secondary | ICD-10-CM | POA: Diagnosis not present

## 2021-11-17 DIAGNOSIS — Z1231 Encounter for screening mammogram for malignant neoplasm of breast: Secondary | ICD-10-CM | POA: Diagnosis not present

## 2021-12-11 ENCOUNTER — Ambulatory Visit (INDEPENDENT_AMBULATORY_CARE_PROVIDER_SITE_OTHER): Payer: BC Managed Care – PPO

## 2021-12-11 ENCOUNTER — Encounter: Payer: Self-pay | Admitting: Nurse Practitioner

## 2021-12-11 ENCOUNTER — Ambulatory Visit: Payer: BC Managed Care – PPO | Admitting: Nurse Practitioner

## 2021-12-11 VITALS — BP 140/88 | HR 91 | Temp 97.8°F | Ht 62.0 in | Wt 176.0 lb

## 2021-12-11 DIAGNOSIS — R0781 Pleurodynia: Secondary | ICD-10-CM

## 2021-12-11 MED ORDER — METHOCARBAMOL 500 MG PO TABS: 500.0000 mg | ORAL_TABLET | Freq: Four times a day (QID) | ORAL | 1 refills | Status: DC

## 2021-12-11 NOTE — Progress Notes (Signed)
Acute Office Visit  Subjective:     Patient ID: Jodi Rios, female    DOB: [birth year], 61 y.o.   MRN: [id number]  Chief Complaint  Patient presents with   Chest Injury    Pt thinks she pulled a muscle in her chest     Chest Pain  This is a new problem. The current episode started yesterday. The onset quality is sudden. The problem occurs constantly. The problem has been unchanged. The pain is present in the lateral region. The pain is at a severity of 7/10. The pain is severe. The quality of the pain is described as sharp. The pain does not radiate. Pertinent negatives include no abdominal pain, back pain, claudication, cough or fever. She has tried acetaminophen and NSAIDs for the symptoms. The treatment provided moderate relief. There are no known risk factors.     Review of Systems  Constitutional: Negative.  Negative for chills and fever.  HENT: Negative.    Respiratory:  Negative for cough.   Cardiovascular:  Positive for chest pain. Negative for claudication.  Gastrointestinal:  Negative for abdominal pain.  Musculoskeletal:  Negative for back pain.  Skin: Negative.  Negative for itching and rash.  All other systems reviewed and are negative.       Objective:    BP (!) 140/88   Pulse 91   Temp 97.8 F (36.6 C)   Ht 5\' 2"  (1.575 m)   Wt 176 lb (79.8 kg)   SpO2 98%   BMI 32.19 kg/m  BP Readings from Last 3 Encounters:  12/11/21 (!) 140/88  10/23/21 133/86  04/25/21 111/75   Wt Readings from Last 3 Encounters:  12/11/21 176 lb (79.8 kg)  10/23/21 175 lb (79.4 kg)  04/25/21 171 lb (77.6 kg)      Physical Exam Vitals and nursing note reviewed.  Constitutional:      Appearance: Normal appearance.  HENT:     Head: Normocephalic.     Right Ear: External ear normal.     Left Ear: External ear normal.     Nose: Nose normal.  Cardiovascular:     Rate and Rhythm: Normal rate and regular rhythm.     Pulses: Normal pulses.     Heart sounds: Normal heart  sounds.  Pulmonary:     Effort: Pulmonary effort is normal.     Breath sounds: Normal breath sounds.  Musculoskeletal:       Arms:     Comments: Left rib pain   Skin:    General: Skin is warm.     Findings: No erythema or rash.  Neurological:     Mental Status: She is alert.     No results found for any visits on 12/11/21.      Assessment & Plan:  Patient presents with left rib pain started a few days ago after pulling a cart at work. Pain is sharp and not well controlled, patient has been using tylenol and Ibuprofen  to help alleviate the pain.  Apply warm compress, continue anti inflammatory, robaxin for musculoskeletal pain, completed chest x-ray to rule out rib fracture. Rest a few days from work and follow up with unresolved symptoms.  Problem List Items Addressed This Visit   None Visit Diagnoses     Rib pain on left side    -  Primary   Relevant Medications   methocarbamol (ROBAXIN) 500 MG tablet   Other Relevant Orders   DG Chest 2 View  Meds ordered this encounter  Medications   methocarbamol (ROBAXIN) 500 MG tablet    Sig: Take 1 tablet (500 mg total) by mouth 4 (four) times daily.    Dispense:  30 tablet    Refill:  1    Order Specific Question:   Supervising Provider    Answer:   Mechele Claude [phone]    Return if symptoms worsen or fail to improve.  Daryll Drown, NP

## 2021-12-11 NOTE — Patient Instructions (Signed)
   Chest Wall Pain Chest wall pain is pain in or around the bones and muscles of your chest. Chest wall pain may be caused by: An injury. Coughing a lot. Using your chest and arm muscles too much. Sometimes, the cause may not be known. This pain may take a few weeks or longer to get better. Follow these instructions at home: Managing pain, stiffness, and swelling If told, put ice on the painful area: Put ice in a plastic bag. Place a towel between your skin and the bag. Leave the ice on for 20 minutes, 2-3 times a day.  Activity Rest as told by your doctor. Avoid doing things that cause pain. This includes lifting heavy items. Ask your doctor what activities are safe for you. General instructions  Take over-the-counter and prescription medicines only as told by your doctor. Do not use any products that contain nicotine or tobacco, such as cigarettes, e-cigarettes, and chewing tobacco. If you need help quitting, ask your doctor. Keep all follow-up visits as told by your doctor. This is important. Contact a doctor if: You have a fever. Your chest pain gets worse. You have new symptoms. Get help right away if: You feel sick to your stomach (nauseous) or you throw up (vomit). You feel sweaty or light-headed. You have a cough with mucus from your lungs (sputum) or you cough up blood. You are short of breath. These symptoms may be an emergency. Do not wait to see if the symptoms will go away. Get medical help right away. Call your local emergency services (911 in the [place] Do not drive yourself to the hospital. Summary Chest wall pain is pain in or around the bones and muscles of your chest. It may be treated with ice, rest, and medicines. Your condition may also get better if you avoid doing things that cause pain. Contact a doctor if you have a fever, chest pain that gets worse, or new symptoms. Get help right away if you feel light-headed or you get short of breath. These symptoms  may be an emergency. This information is not intended to replace advice given to you by your health care provider. Make sure you discuss any questions you have with your health care provider. Document Revised: 05/17/2020 Document Reviewed: 04/29/2020 Elsevier Patient Education  2023 [employer]  

## 2021-12-12 ENCOUNTER — Telehealth: Payer: Self-pay | Admitting: Family Medicine

## 2021-12-12 NOTE — Telephone Encounter (Signed)
Please follow up

## 2021-12-12 NOTE — Telephone Encounter (Signed)
Please review and advise.

## 2021-12-12 NOTE — Telephone Encounter (Signed)
Please check result notes from labs in pools. Thank you

## 2021-12-12 NOTE — Telephone Encounter (Signed)
Patient has called the office several times today requesting xray results. Patient has been rude and demanding on the phone. Ive tried explaining to the patient that we have to wait until the provider reviews the results before we can give her the results. And also have reviewed every note made on her chart regarding results. Patient is not listening. Says she already seen on mychart and demands to speak with a provider directly. Patient has hung the phone up on me more than once today.  If patient calls back, please transfer her to the office manager regarding behavior.

## 2022-01-23 ENCOUNTER — Ambulatory Visit (INDEPENDENT_AMBULATORY_CARE_PROVIDER_SITE_OTHER): Payer: BC Managed Care – PPO | Admitting: Nurse Practitioner

## 2022-01-23 ENCOUNTER — Telehealth: Payer: Self-pay | Admitting: Family Medicine

## 2022-01-23 ENCOUNTER — Encounter: Payer: Self-pay | Admitting: Nurse Practitioner

## 2022-01-23 DIAGNOSIS — U071 COVID-19: Secondary | ICD-10-CM

## 2022-01-23 LAB — CMP14+EGFR
ALT: 31 IU/L (ref 0–32)
AST: 24 IU/L (ref 0–40)
Albumin/Globulin Ratio: 2 (ref 1.2–2.2)
Albumin: 4 g/dL (ref 3.9–4.9)
Alkaline Phosphatase: 152 IU/L — ABNORMAL HIGH (ref 44–121)
BUN/Creatinine Ratio: 13 (ref 12–28)
BUN: 8 mg/dL (ref 8–27)
Bilirubin Total: 0.4 mg/dL (ref 0.0–1.2)
CO2: 26 mmol/L (ref 20–29)
Calcium: 9 mg/dL (ref 8.7–10.3)
Chloride: 105 mmol/L (ref 96–106)
Creatinine, Ser: 0.64 mg/dL (ref 0.57–1.00)
Globulin, Total: 2 g/dL (ref 1.5–4.5)
Glucose: 150 mg/dL — ABNORMAL HIGH (ref 70–99)
Potassium: 3.5 mmol/L (ref 3.5–5.2)
Sodium: 143 mmol/L (ref 134–144)
Total Protein: 6 g/dL (ref 6.0–8.5)
eGFR: 100 mL/min/{1.73_m2} (ref >59)

## 2022-01-23 MED ORDER — GUAIFENESIN ER 600 MG PO TB12: 600.0000 mg | ORAL_TABLET | Freq: Two times a day (BID) | ORAL | 0 refills | Status: DC

## 2022-01-23 MED ORDER — FLUTICASONE PROPIONATE 50 MCG/ACT NA SUSP: 2.0000 | Freq: Every day | NASAL | 6 refills | Status: DC

## 2022-01-23 MED ORDER — NIRMATRELVIR/RITONAVIR (PAXLOVID)TABLET: 3.0000 | ORAL_TABLET | Freq: Two times a day (BID) | ORAL | 0 refills | Status: AC

## 2022-01-23 NOTE — Patient Instructions (Signed)

## 2022-01-23 NOTE — Telephone Encounter (Signed)
Critical Lab Results per [facility]  Glucose 150 (high) Alkaline 5.2 (High)  Everything else came back normal.

## 2022-01-23 NOTE — Progress Notes (Signed)
   Virtual Visit  Note Due to COVID-19 pandemic this visit was conducted virtually. This visit type was conducted due to national recommendations for restrictions regarding the COVID-19 Pandemic (e.g. social distancing, sheltering in place) in an effort to limit this patient's exposure and mitigate transmission in our community. All issues noted in this document were discussed and addressed.  A physical exam was not performed with this format.  I connected with Jodi Rios on 01/23/22 at 08:00 am  by telephone and verified that I am speaking with the correct person using two identifiers. Jodi Rios is currently located at home during visit. The provider, Daryll Drown, NP is located in their office at time of visit.  I discussed the limitations, risks, security and privacy concerns of performing an evaluation and management service by telephone and the availability of in person appointments. I also discussed with the patient that there may be a patient responsible charge related to this service. The patient expressed understanding and agreed to proceed.   History and Present Illness:  URI  This is a new problem. The current episode started yesterday. The problem has been unchanged. There has been no fever. Associated symptoms include congestion, coughing, headaches and sinus pain. Pertinent negatives include no dysuria, ear pain or rash. She has tried acetaminophen for the symptoms. The treatment provided no relief.      Review of Systems  Constitutional:  Positive for fever. Negative for chills and weight loss.  HENT:  Positive for congestion and sinus pain. Negative for ear pain.   Respiratory:  Positive for cough.   Cardiovascular: Negative.   Genitourinary:  Negative for dysuria.  Skin: Negative.  Negative for itching and rash.  Neurological:  Positive for headaches.  All other systems reviewed and are negative.    Observations/Objective: Tele viait, patient is not in  distress  Assessment and Plan: Patient presents with URI, she is positive for COVID-19, advised patient to Take meds as prescribed - Use a cool mist humidifier  -Use saline nose sprays frequently -Force fluids -For fever or aches or pains- take Tylenol or ibuprofen. -Paxlovid antiviral RX sent to pharmacy, education provided   Follow Up Instructions: Follow up with worsening unresolved symptoms     I discussed the assessment and treatment plan with the patient. The patient was provided an opportunity to ask questions and all were answered. The patient agreed with the plan and demonstrated an understanding of the instructions.   The patient was advised to call back or seek an in-person evaluation if the symptoms worsen or if the condition fails to improve as anticipated.  The above assessment and management plan was discussed with the patient. The patient verbalized understanding of and has agreed to the management plan. Patient is aware to call the clinic if symptoms persist or worsen. Patient is aware when to return to the clinic for a follow-up visit. Patient educated on when it is appropriate to go to the emergency department.   Time call ended:    I provided 15 minutes of  non face-to-face time during this encounter.    Daryll Drown, NP

## 2022-01-23 NOTE — Telephone Encounter (Signed)
I was looking for STAT GFR to order Paxlovid for covid treatment, I was able to manually calculate GFR and send Medication to pharmacy. I called patient and she is aware of her medication in the pharmacy.

## 2022-01-31 ENCOUNTER — Telehealth: Payer: Self-pay | Admitting: Family Medicine

## 2022-01-31 NOTE — Telephone Encounter (Signed)
TELEVISIT SCHEDULED

## 2022-01-31 NOTE — Telephone Encounter (Signed)
Patient is still having congestion. Wants to know if an antibiotic can be called in to [facility] in [facility]. She had an appointment on 11/28 and tested positive for COVID. Please call back to let her know if anything else can be called in.

## 2022-02-01 ENCOUNTER — Ambulatory Visit (INDEPENDENT_AMBULATORY_CARE_PROVIDER_SITE_OTHER): Payer: BC Managed Care – PPO | Admitting: Family Medicine

## 2022-02-01 ENCOUNTER — Encounter: Payer: Self-pay | Admitting: Family Medicine

## 2022-02-01 DIAGNOSIS — J0110 Acute frontal sinusitis, unspecified: Secondary | ICD-10-CM

## 2022-02-01 MED ORDER — PREDNISONE 20 MG PO TABS: ORAL_TABLET | ORAL | 0 refills | Status: DC

## 2022-02-01 MED ORDER — DOXYCYCLINE HYCLATE 100 MG PO TABS: 100.0000 mg | ORAL_TABLET | Freq: Two times a day (BID) | ORAL | 0 refills | Status: DC

## 2022-02-01 NOTE — Progress Notes (Signed)
Virtual Visit via telephone Note  I connected with Jodi Rios on 02/01/22 at 1551 by telephone and verified that I am speaking with the correct person using two identifiers. Jodi Rios is currently located at home and patient are currently with her during visit. The provider, Elige Radon Annalicia Renfrew, MD is located in their office at time of visit.  Call ended at 1557  I discussed the limitations, risks, security and privacy concerns of performing an evaluation and management service by telephone and the availability of in person appointments. I also discussed with the patient that there may be a patient responsible charge related to this service. The patient expressed understanding and agreed to proceed.   History and Present Illness: Patient had covid but is now have sinus congestion and bloody drainage. She has coughing. She denies wheezing and SOB and has inhaler but has not had to use it.  She was treated for covid 2 weeks ago and never fully got over it. She noticed sinus stuff starting back up 2 days ago.  She gets sinus issues and has a lot of allergies. She is using mucinex and flonase and zyrtec.   1. Subacute frontal sinusitis     Outpatient Encounter Medications as of 02/01/2022  Medication Sig   doxycycline (VIBRA-TABS) 100 MG tablet Take 1 tablet (100 mg total) by mouth 2 (two) times daily. 1 po bid   predniSONE (DELTASONE) 20 MG tablet 2 po at same time daily for 5 days   albuterol (PROVENTIL) (2.5 MG/3ML) 0.083% nebulizer solution 2.5 mg every four (4) hours as needed.   albuterol (VENTOLIN HFA) 108 (90 Base) MCG/ACT inhaler Inhale 1-2 puffs into the lungs every 6 (six) hours as needed for wheezing or shortness of breath.   ascorbic Acid (VITAMIN C) 500 MG CPCR Take by mouth.   cetirizine (ZYRTEC) 10 MG tablet Take 1 tablet (10 mg total) by mouth daily.   Cholecalciferol 25 MCG (1000 UT) tablet Take by mouth.   fluticasone (FLONASE) 50 MCG/ACT nasal spray Place 2 sprays  into both nostrils daily.   guaiFENesin (MUCINEX) 600 MG 12 hr tablet Take 1 tablet (600 mg total) by mouth 2 (two) times daily.   levothyroxine (SYNTHROID) 25 MCG tablet Take 1 tablet (25 mcg total) by mouth daily.   methocarbamol (ROBAXIN) 500 MG tablet Take 1 tablet (500 mg total) by mouth 4 (four) times daily.   Multiple Vitamin (MULTI-VITAMIN) tablet Take by mouth.   omeprazole (PRILOSEC) 20 MG capsule Take 1 capsule (20 mg total) by mouth daily.   verapamil (CALAN) 80 MG tablet Take 1 tablet (80 mg total) by mouth daily.   No facility-administered encounter medications on file as of 02/01/2022.    Review of Systems  Constitutional:  Negative for chills and fever.  HENT:  Positive for congestion, postnasal drip, rhinorrhea and sinus pressure. Negative for ear discharge, ear pain, sneezing and sore throat.   Eyes:  Negative for pain, redness and visual disturbance.  Respiratory:  Positive for cough. Negative for chest tightness and shortness of breath.   Cardiovascular:  Negative for chest pain and leg swelling.  Genitourinary:  Negative for difficulty urinating and dysuria.  Musculoskeletal:  Negative for back pain and gait problem.  Skin:  Negative for rash.  Neurological:  Negative for light-headedness and headaches.  Psychiatric/Behavioral:  Negative for agitation and behavioral problems.   All other systems reviewed and are negative.   Observations/Objective: Patient sounds comfortable and in no acute distress  Assessment and Plan: Problem List Items Addressed This Visit   None Visit Diagnoses     Subacute frontal sinusitis    -  Primary   Relevant Medications   doxycycline (VIBRA-TABS) 100 MG tablet   predniSONE (DELTASONE) 20 MG tablet     Sounds like viral infection that turned into bacterial infection, will treat with doxycycline but because of her history of allergies will also give some prednisone.  Follow up plan: Return if symptoms worsen or fail to  improve.     I discussed the assessment and treatment plan with the patient. The patient was provided an opportunity to ask questions and all were answered. The patient agreed with the plan and demonstrated an understanding of the instructions.   The patient was advised to call back or seek an in-person evaluation if the symptoms worsen or if the condition fails to improve as anticipated.  The above assessment and management plan was discussed with the patient. The patient verbalized understanding of and has agreed to the management plan. Patient is aware to call the clinic if symptoms persist or worsen. Patient is aware when to return to the clinic for a follow-up visit. Patient educated on when it is appropriate to go to the emergency department.    I provided 6 minutes of non-face-to-face time during this encounter.    Worthy Rancher, MD

## 2022-02-12 ENCOUNTER — Other Ambulatory Visit: Payer: Self-pay | Admitting: Family Medicine

## 2022-06-09 ENCOUNTER — Other Ambulatory Visit: Payer: Self-pay | Admitting: Family Medicine

## 2022-06-09 DIAGNOSIS — J4520 Mild intermittent asthma, uncomplicated: Secondary | ICD-10-CM

## 2022-06-25 ENCOUNTER — Ambulatory Visit (INDEPENDENT_AMBULATORY_CARE_PROVIDER_SITE_OTHER): Payer: BC Managed Care – PPO

## 2022-06-25 ENCOUNTER — Ambulatory Visit: Payer: BC Managed Care – PPO | Admitting: Family

## 2022-06-25 ENCOUNTER — Encounter: Payer: Self-pay | Admitting: Family

## 2022-06-25 VITALS — BP 132/82 | HR 95 | Temp 97.6°F | Ht 62.0 in | Wt 175.0 lb

## 2022-06-25 DIAGNOSIS — J0110 Acute frontal sinusitis, unspecified: Secondary | ICD-10-CM

## 2022-06-25 DIAGNOSIS — M79672 Pain in left foot: Secondary | ICD-10-CM | POA: Diagnosis not present

## 2022-06-25 MED ORDER — PREDNISONE 10 MG (21) PO TBPK: ORAL_TABLET | ORAL | 0 refills | Status: DC

## 2022-06-25 MED ORDER — DOXYCYCLINE HYCLATE 100 MG PO TABS: 100.0000 mg | ORAL_TABLET | Freq: Two times a day (BID) | ORAL | 0 refills | Status: DC

## 2022-06-25 MED ORDER — DICLOFENAC SODIUM 75 MG PO TBEC
75.0000 mg | DELAYED_RELEASE_TABLET | Freq: Two times a day (BID) | ORAL | 0 refills | Status: DC
Start: 2022-06-25 — End: 2022-07-09

## 2022-06-25 NOTE — Patient Instructions (Signed)

## 2022-06-25 NOTE — Progress Notes (Addendum)
Subjective:    Patient ID: Jodi Rios, female    DOB: 07/04/1960, 62 y.o.   MRN: [id number]  Chief Complaint  Patient presents with  . Foot Pain    Left foot pain on top of foot.   . Sinus Problem   PT presents to the office today with sinus issues and foot pain.  Foot Pain This is a new problem. The current episode started 1 to 4 weeks ago. The problem occurs constantly. The problem has been unchanged. Associated symptoms include chills, congestion, coughing (just in morning), headaches and a sore throat. The symptoms are aggravated by standing (bending her toes, and walking). She has tried acetaminophen, NSAIDs and ice for the symptoms. The treatment provided mild relief.  Sinus Problem This is a new problem. The current episode started 1 to 4 weeks ago. The problem has been gradually worsening since onset. There has been no fever. Her pain is at a severity of 6/10. The pain is mild. Associated symptoms include chills, congestion, coughing (just in morning), ear pain, headaches, sinus pressure, sneezing and a sore throat. Pertinent negatives include no shortness of breath. Treatments tried: zyrtec, albuterol, and flonase. The treatment provided mild relief.      Review of Systems  Constitutional:  Positive for chills.  HENT:  Positive for congestion, ear pain, sinus pressure, sneezing and sore throat.   Respiratory:  Positive for cough (just in morning). Negative for shortness of breath.   Neurological:  Positive for headaches.  All other systems reviewed and are negative.      Objective:   Physical Exam Vitals reviewed.  Constitutional:      General: She is not in acute distress.    Appearance: She is well-developed.  HENT:     Head: Normocephalic and atraumatic.     Nose: Congestion present.     Right Sinus: Frontal sinus tenderness present. No maxillary sinus tenderness.     Left Sinus: Frontal sinus tenderness present.  Eyes:     Pupils: Pupils are equal, round,  and reactive to light.  Neck:     Thyroid: No thyromegaly.  Cardiovascular:     Rate and Rhythm: Normal rate and regular rhythm.     Heart sounds: Normal heart sounds. No murmur heard. Pulmonary:     Effort: Pulmonary effort is normal. No respiratory distress.     Breath sounds: Normal breath sounds. No wheezing.  Abdominal:     General: Bowel sounds are normal. There is no distension.     Palpations: Abdomen is soft.     Tenderness: There is no abdominal tenderness.  Musculoskeletal:        General: Tenderness present. Normal range of motion.     Cervical back: Normal range of motion and neck supple.       Feet:  Feet:     Comments: Tenderness mild warmth, pain with flexion of toes Skin:    General: Skin is warm and dry.  Neurological:     Mental Status: She is alert and oriented to person, place, and time.     Cranial Nerves: No cranial nerve deficit.     Deep Tendon Reflexes: Reflexes are normal and symmetric.  Psychiatric:        Behavior: Behavior normal.        Thought Content: Thought content normal.        Judgment: Judgment normal.      BP 132/82   Pulse 95   Temp 97.6 F (36.4 C) (  Temporal)   Ht 5\' 2"  (1.575 m)   Wt 175 lb (79.4 kg)   SpO2 96%   BMI 32.01 kg/m      Assessment & Plan:  Jodi Rios comes in today with chief complaint of Foot Pain (Left foot pain on top of foot. ) and Sinus Problem   Diagnosis and orders addressed:  1. Acute non-recurrent frontal sinusitis - Take meds as prescribed - Use a cool mist humidifier  -Use saline nose sprays frequently -Force fluids -For any cough or congestion  Use plain Mucinex- regular strength or max strength is fine -For fever or aces or pains- take tylenol or ibuprofen. -Throat lozenges if help -Follow up if symptoms worsen  - doxycycline (VIBRA-TABS) 100 MG tablet; Take 1 tablet (100 mg total) by mouth 2 (two) times daily.  Dispense: 20 tablet; Refill: 0 - predniSONE (STERAPRED UNI-PAK 21 TAB)  10 MG (21) TBPK tablet; Use as directed  Dispense: 21 tablet; Refill: 0  2. Left foot pain Rest Start diclofenac BID with food No other NSAID's  - predniSONE (STERAPRED UNI-PAK 21 TAB) 10 MG (21) TBPK tablet; Use as directed  Dispense: 21 tablet; Refill: 0 - diclofenac (VOLTAREN) 75 MG EC tablet; Take 1 tablet (75 mg total) by mouth 2 (two) times daily.  Dispense: 30 tablet; Refill: 0 - DG Foot Complete Left     Jannifer Rodney, FNP

## 2022-06-28 NOTE — Progress Notes (Signed)
Pt r/c.

## 2022-07-09 ENCOUNTER — Other Ambulatory Visit: Payer: Self-pay | Admitting: Family

## 2022-07-09 DIAGNOSIS — M79672 Pain in left foot: Secondary | ICD-10-CM

## 2022-08-07 ENCOUNTER — Other Ambulatory Visit: Payer: Self-pay | Admitting: Family Medicine

## 2022-08-15 DIAGNOSIS — B9689 Other specified bacterial agents as the cause of diseases classified elsewhere: Secondary | ICD-10-CM | POA: Diagnosis not present

## 2022-08-15 DIAGNOSIS — J019 Acute sinusitis, unspecified: Secondary | ICD-10-CM | POA: Diagnosis not present

## 2022-08-15 DIAGNOSIS — L237 Allergic contact dermatitis due to plants, except food: Secondary | ICD-10-CM | POA: Diagnosis not present

## 2022-08-23 IMAGING — DX DG CHEST 2V
2 series · 2 of 2 positions shown · non-contrast
Comparison: 11/27/2019

CLINICAL DATA: Shortness of breath, cough

EXAM:
CHEST - 2 VIEW

[chest pa]
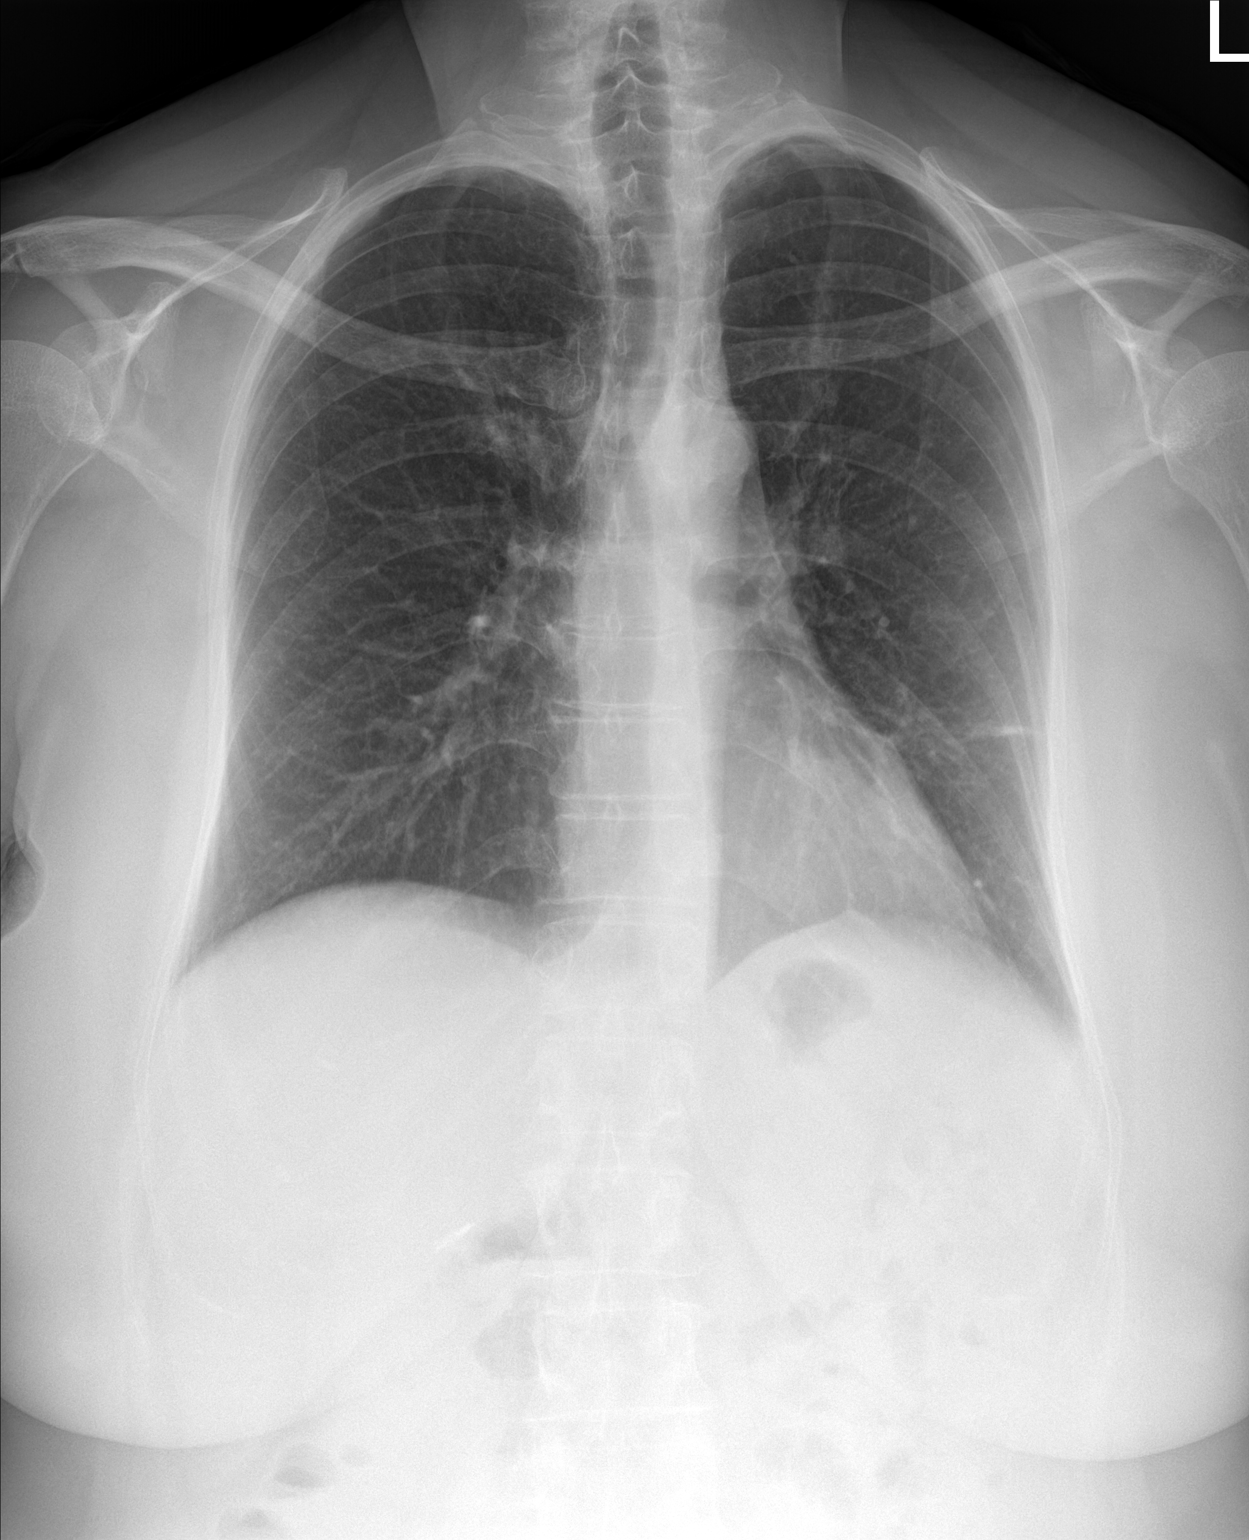

[chest lat]
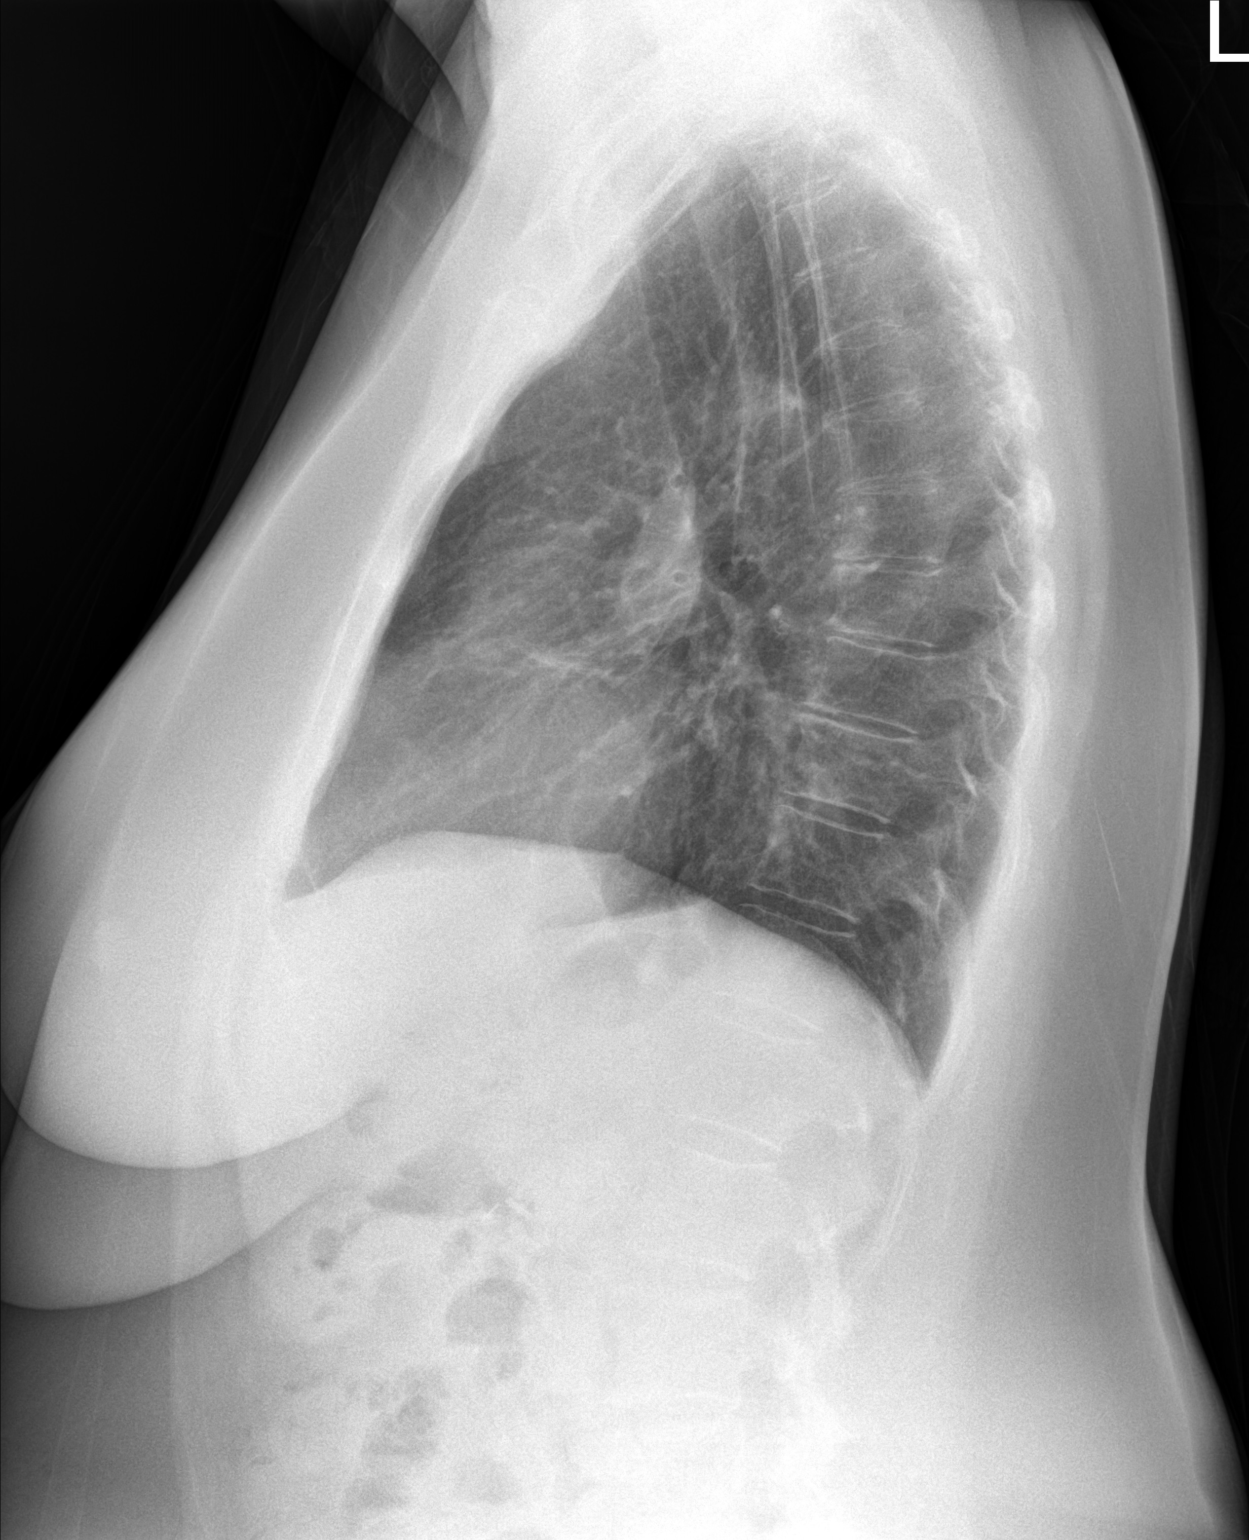

[2 of 2 positions shown; findings below may reference images not displayed]

FINDINGS: Minimal linear scarring or subsegmental atelectasis laterally in the
lingula. Lungs otherwise clear.

Heart size and mediastinal contours are within normal limits.

No effusion.

Visualized bones unremarkable.  Cholecystectomy clips.
IMPRESSION: Minimal linear lingular scarring or atelectasis. Otherwise negative.

## 2022-08-27 DIAGNOSIS — J329 Chronic sinusitis, unspecified: Secondary | ICD-10-CM | POA: Diagnosis not present

## 2022-08-28 ENCOUNTER — Ambulatory Visit: Payer: BC Managed Care – PPO | Admitting: Nurse Practitioner

## 2022-09-12 ENCOUNTER — Other Ambulatory Visit: Payer: Self-pay | Admitting: Family Medicine

## 2022-09-12 DIAGNOSIS — J4520 Mild intermittent asthma, uncomplicated: Secondary | ICD-10-CM

## 2022-09-18 ENCOUNTER — Encounter: Payer: Self-pay | Admitting: Family Medicine

## 2022-09-18 ENCOUNTER — Ambulatory Visit: Payer: BC Managed Care – PPO | Admitting: Family Medicine

## 2022-09-18 VITALS — BP 120/68 | HR 90 | Temp 97.9°F | Ht 62.0 in | Wt 172.0 lb

## 2022-09-18 DIAGNOSIS — J069 Acute upper respiratory infection, unspecified: Secondary | ICD-10-CM

## 2022-09-18 DIAGNOSIS — J0100 Acute maxillary sinusitis, unspecified: Secondary | ICD-10-CM | POA: Diagnosis not present

## 2022-09-18 MED ORDER — MOXIFLOXACIN HCL 400 MG PO TABS: 400.0000 mg | ORAL_TABLET | Freq: Every day | ORAL | 0 refills | Status: DC

## 2022-09-18 MED ORDER — PREDNISONE 10 MG PO TABS: ORAL_TABLET | ORAL | 0 refills | Status: DC

## 2022-09-18 NOTE — Progress Notes (Signed)
Chief Complaint  Patient presents with   Sinusitis   Cough   Nasal Congestion    HPI  Patient presents today for congestion, Symptoms include congestion, facial pain, nasal congestion, non productive cough, post nasal drip and sinus pressure. There is no fever, chills, or sweats. Onset of symptoms was a few days ago, gradually worsening since that time. Tried abx and steroids from UC 3 weeks ago. Hx allergies   PMH: Smoking status noted ROS: Per HPI  Objective: BP 120/68   Pulse 90   Temp 97.9 F (36.6 C)   Ht 5\' 2"  (1.575 m)   Wt 172 lb (78 kg)   SpO2 94%   BMI 31.46 kg/m  Gen: NAD, alert, cooperative with exam HEENT: NCAT, EOMI, PERRL. tMs clear nasal passages inflamed  CV: RRR, good S1/S2, no murmur Resp: CTABL, occ faint wheeze Ext: No edema, warm Neuro: Alert and oriented, No gross deficits  Assessment and plan:  1. URI with cough and congestion   2. Acute maxillary sinusitis, recurrence not specified     Meds ordered this encounter  Medications   moxifloxacin (AVELOX) 400 MG tablet    Sig: Take 1 tablet (400 mg total) by mouth daily.    Dispense:  10 tablet    Refill:  0   predniSONE (DELTASONE) 10 MG tablet    Sig: Take 5 daily for 2 days followed by 4,3,2 and 1 for 2 days each.    Dispense:  30 tablet    Refill:  0    Orders Placed This Encounter  Procedures   Novel Coronavirus, NAA ([facility])    Order Specific Question:   Previously tested for COVID-19    Answer:   Unknown    Order Specific Question:   Resident in a congregate (group) care setting    Answer:   Unknown    Order Specific Question:   Is the patient student?    Answer:   No    Order Specific Question:   Employed in healthcare setting    Answer:   Unknown    Order Specific Question:   Pregnant    Answer:   Unknown    Order Specific Question:   Has patient completed COVID vaccination(s) (2 doses of Pfizer/Moderna 1 dose of [employer])    Answer:   Unknown    Order Specific  Question:   Release to patient    Answer:   Immediate    Follow up as needed.  Mechele Claude, MD

## 2022-09-19 LAB — NOVEL CORONAVIRUS, NAA: SARS-CoV-2, NAA: NOT DETECTED

## 2022-10-22 ENCOUNTER — Telehealth: Payer: Self-pay | Admitting: Family Medicine

## 2022-10-22 DIAGNOSIS — Z0000 Encounter for general adult medical examination without abnormal findings: Secondary | ICD-10-CM

## 2022-10-22 DIAGNOSIS — E034 Atrophy of thyroid (acquired): Secondary | ICD-10-CM

## 2022-10-22 NOTE — Telephone Encounter (Signed)
Orders placed.  Tried to call patient, no answer, mailbox full

## 2022-10-22 NOTE — Telephone Encounter (Signed)
PAtient aware. MM

## 2022-10-23 ENCOUNTER — Other Ambulatory Visit: Payer: BC Managed Care – PPO

## 2022-10-23 DIAGNOSIS — Z0000 Encounter for general adult medical examination without abnormal findings: Secondary | ICD-10-CM | POA: Diagnosis not present

## 2022-10-23 DIAGNOSIS — E034 Atrophy of thyroid (acquired): Secondary | ICD-10-CM

## 2022-10-24 LAB — CMP14+EGFR
ALT: 29 IU/L (ref 0–32)
AST: 24 IU/L (ref 0–40)
Albumin: 3.9 g/dL (ref 3.9–4.9)
Alkaline Phosphatase: 139 IU/L — ABNORMAL HIGH (ref 44–121)
BUN/Creatinine Ratio: 15 (ref 12–28)
BUN: 10 mg/dL (ref 8–27)
Bilirubin Total: 0.6 mg/dL (ref 0.0–1.2)
CO2: 23 mmol/L (ref 20–29)
Calcium: 9.3 mg/dL (ref 8.7–10.3)
Chloride: 104 mmol/L (ref 96–106)
Creatinine, Ser: 0.68 mg/dL (ref 0.57–1.00)
Globulin, Total: 2.1 g/dL (ref 1.5–4.5)
Glucose: 88 mg/dL (ref 70–99)
Potassium: 4.1 mmol/L (ref 3.5–5.2)
Sodium: 142 mmol/L (ref 134–144)
Total Protein: 6 g/dL (ref 6.0–8.5)
eGFR: 98 mL/min/{1.73_m2} (ref >59)

## 2022-10-24 LAB — LIPID PANEL
Chol/HDL Ratio: 2.3 ratio (ref 0.0–4.4)
Cholesterol, Total: 138 mg/dL (ref 100–199)
HDL: 59 mg/dL (ref >39)
LDL Chol Calc (NIH): 65 mg/dL (ref 0–99)
Triglycerides: 68 mg/dL (ref 0–149)
VLDL Cholesterol Cal: 14 mg/dL (ref 5–40)

## 2022-10-24 LAB — CBC WITH DIFFERENTIAL/PLATELET
Basophils Absolute: 0.1 10*3/uL (ref 0.0–0.2)
Basos: 1 %
EOS (ABSOLUTE): 0.1 10*3/uL (ref 0.0–0.4)
Eos: 2 %
Hematocrit: 44.8 % (ref 34.0–46.6)
Hemoglobin: 14.6 g/dL (ref 11.1–15.9)
Immature Grans (Abs): 0 10*3/uL (ref 0.0–0.1)
Immature Granulocytes: 0 %
Lymphocytes Absolute: 1.7 10*3/uL (ref 0.7–3.1)
Lymphs: 25 %
MCH: 29.9 pg (ref 26.6–33.0)
MCHC: 32.6 g/dL (ref 31.5–35.7)
MCV: 92 fL (ref 79–97)
Monocytes Absolute: 0.9 10*3/uL (ref 0.1–0.9)
Monocytes: 13 %
Neutrophils Absolute: 3.8 10*3/uL (ref 1.4–7.0)
Neutrophils: 59 %
Platelets: 327 10*3/uL (ref 150–450)
RBC: 4.88 x10E6/uL (ref 3.77–5.28)
RDW: 12.5 % (ref 11.7–15.4)
WBC: 6.6 10*3/uL (ref 3.4–10.8)

## 2022-10-24 LAB — THYROID PANEL WITH TSH
Free Thyroxine Index: 2.9 (ref 1.2–4.9)
T3 Uptake Ratio: 26 % (ref 24–39)
T4, Total: 11.1 ug/dL (ref 4.5–12.0)
TSH: 4.43 u[IU]/mL (ref 0.450–4.500)

## 2022-10-25 ENCOUNTER — Encounter: Payer: Self-pay | Admitting: Family Medicine

## 2022-10-25 ENCOUNTER — Ambulatory Visit (INDEPENDENT_AMBULATORY_CARE_PROVIDER_SITE_OTHER): Payer: BC Managed Care – PPO | Admitting: Family Medicine

## 2022-10-25 ENCOUNTER — Telehealth: Payer: Self-pay | Admitting: Family Medicine

## 2022-10-25 VITALS — BP 119/79 | HR 85 | Temp 98.0°F | Ht 62.0 in | Wt 173.4 lb

## 2022-10-25 DIAGNOSIS — Z0001 Encounter for general adult medical examination with abnormal findings: Secondary | ICD-10-CM

## 2022-10-25 DIAGNOSIS — I341 Nonrheumatic mitral (valve) prolapse: Secondary | ICD-10-CM

## 2022-10-25 DIAGNOSIS — J4540 Moderate persistent asthma, uncomplicated: Secondary | ICD-10-CM | POA: Diagnosis not present

## 2022-10-25 DIAGNOSIS — Z0000 Encounter for general adult medical examination without abnormal findings: Secondary | ICD-10-CM

## 2022-10-25 DIAGNOSIS — E034 Atrophy of thyroid (acquired): Secondary | ICD-10-CM

## 2022-10-25 MED ORDER — OMEPRAZOLE 20 MG PO CPDR: 20.0000 mg | DELAYED_RELEASE_CAPSULE | Freq: Every day | ORAL | 3 refills | Status: DC

## 2022-10-25 MED ORDER — ALBUTEROL SULFATE HFA 108 (90 BASE) MCG/ACT IN AERS
1.0000 | INHALATION_SPRAY | Freq: Four times a day (QID) | RESPIRATORY_TRACT | 3 refills | Status: DC | PRN
Start: 2022-10-25 — End: 2023-07-11

## 2022-10-25 MED ORDER — VERAPAMIL HCL 80 MG PO TABS: 80.0000 mg | ORAL_TABLET | Freq: Every day | ORAL | 3 refills | Status: DC

## 2022-10-25 MED ORDER — LEVOTHYROXINE SODIUM 50 MCG PO TABS: 50.0000 ug | ORAL_TABLET | Freq: Every day | ORAL | 1 refills | Status: DC

## 2022-10-25 NOTE — Telephone Encounter (Signed)
Future lab orders placed

## 2022-10-25 NOTE — Progress Notes (Signed)
Subjective:  Patient ID: Jodi Rios, female    DOB: Jan 18, 1961  Age: 62 y.o. MRN: [id number]  CC: Annual Exam   HPI Jodi Rios presents for Annual exam.   follow-up on  thyroid. The patient has a history of hypothyroidism for many years. It has been stable recently. Pt. reports loss of hair, heat or cold intolerance. Energy level has been adequate only. Patient denies constipation and diarrhea. No myxedema. Medication is as noted below. Verified that pt is taking it daily on an empty stomach. Well tolerated.        10/25/2022    3:17 PM 09/18/2022   11:46 AM 06/25/2022   10:36 AM  Depression screen PHQ 2/9  Decreased Interest 0 0 0  Down, Depressed, Hopeless 0 0 0  PHQ - 2 Score 0 0 0  Altered sleeping   0  Tired, decreased energy   0  Change in appetite   0  Feeling bad or failure about yourself    0  Trouble concentrating   0  Moving slowly or fidgety/restless   0  Suicidal thoughts   0  PHQ-9 Score   0  Difficult doing work/chores   Not difficult at all    History Jodi Rios has a past medical history of Anemia, GERD (gastroesophageal reflux disease), Mitral valve prolapse (1990), and Thyroid disease.   She has a past surgical history that includes Cholecystectomy and Cesarean section.   Her family history includes Cervical cancer in her sister; Diabetes in her son; Hyperlipidemia in her father; Multiple sclerosis in her sister; Stroke in her mother; Thyroid disease in her mother.She reports that she has never smoked. She uses smokeless tobacco. She reports that she does not currently use alcohol. She reports that she does not use drugs.    ROS Review of Systems  Constitutional:  Negative for appetite change, chills, diaphoresis, fatigue, fever and unexpected weight change.  HENT:  Negative for congestion, ear pain, hearing loss, postnasal drip, rhinorrhea, sneezing, sore throat and trouble swallowing.   Eyes:  Negative for pain.  Respiratory:  Negative for cough,  chest tightness and shortness of breath.   Cardiovascular:  Negative for chest pain and palpitations.  Gastrointestinal:  Negative for abdominal pain, constipation, diarrhea, nausea and vomiting.  Endocrine: Negative for cold intolerance, heat intolerance, polydipsia, polyphagia and polyuria.  Genitourinary:  Negative for dysuria, frequency and menstrual problem.  Musculoskeletal:  Negative for arthralgias and joint swelling.  Skin:  Negative for rash.  Allergic/Immunologic: Negative for environmental allergies.  Neurological:  Negative for dizziness, weakness, numbness and headaches.  Psychiatric/Behavioral:  Negative for agitation and dysphoric mood.     Objective:  BP 119/79   Pulse 85   Temp 98 F (36.7 C)   Ht 5\' 2"  (1.575 m)   Wt 173 lb 6.4 oz (78.7 kg)   SpO2 95%   BMI 31.72 kg/m   BP Readings from Last 3 Encounters:  10/25/22 119/79  09/18/22 120/68  06/25/22 132/82    Wt Readings from Last 3 Encounters:  10/25/22 173 lb 6.4 oz (78.7 kg)  09/18/22 172 lb (78 kg)  06/25/22 175 lb (79.4 kg)     Physical Exam Constitutional:      General: She is not in acute distress.    Appearance: She is well-developed.  HENT:     Head: Normocephalic and atraumatic.  Eyes:     Conjunctiva/sclera: Conjunctivae normal.     Pupils: Pupils are equal, round, and reactive to  light.  Neck:     Thyroid: No thyromegaly.  Cardiovascular:     Rate and Rhythm: Normal rate and regular rhythm.     Heart sounds: Normal heart sounds. No murmur heard. Pulmonary:     Effort: Pulmonary effort is normal. No respiratory distress.     Breath sounds: Normal breath sounds. No wheezing or rales.  Abdominal:     General: Bowel sounds are normal. There is no distension.     Palpations: Abdomen is soft.     Tenderness: There is no abdominal tenderness.  Musculoskeletal:        General: Normal range of motion.     Cervical back: Normal range of motion and neck supple.  Lymphadenopathy:      Cervical: No cervical adenopathy.  Skin:    General: Skin is warm and dry.  Neurological:     Mental Status: She is alert and oriented to person, place, and time.  Psychiatric:        Behavior: Behavior normal.        Thought Content: Thought content normal.        Judgment: Judgment normal.       Assessment & Plan:   Jodi Rios" was seen today for annual exam.  Diagnoses and all orders for this visit:  Well adult exam  Moderate persistent asthma, unspecified whether complicated -     albuterol (VENTOLIN HFA) 108 (90 Base) MCG/ACT inhaler; Inhale 1-2 puffs into the lungs every 6 (six) hours as needed for wheezing or shortness of breath.  MVP (mitral valve prolapse)  Other orders -     levothyroxine (SYNTHROID) 50 MCG tablet; Take 1 tablet (50 mcg total) by mouth daily. -     omeprazole (PRILOSEC) 20 MG capsule; Take 1 capsule (20 mg total) by mouth daily. -     verapamil (CALAN) 80 MG tablet; Take 1 tablet (80 mg total) by mouth daily.       I have discontinued Altamease Oiler. Raulerson "Sue"'s fluticasone, diclofenac, moxifloxacin, and predniSONE. I have also changed her levothyroxine. Additionally, I am having her maintain her Multi-Vitamin, ascorbic Acid, Cholecalciferol, albuterol, cetirizine, albuterol, omeprazole, and verapamil.  Allergies as of 10/25/2022       Reactions   Codeine Other (See Comments)   Nitrofurantoin Other (See Comments)   Ofloxacin Other (See Comments)   Penicillins Other (See Comments)   Sulfamethoxazole Other (See Comments)        Medication List        Accurate as of October 25, 2022  3:47 PM. If you have any questions, ask your nurse or doctor.          STOP taking these medications    diclofenac 75 MG EC tablet Commonly known as: VOLTAREN Stopped by: Mcclain Shall   fluticasone 50 MCG/ACT nasal spray Commonly known as: FLONASE Stopped by: Charmika Macdonnell   moxifloxacin 400 MG tablet Commonly known as: AVELOX Stopped by: Elfie Costanza   predniSONE 10 MG tablet Commonly known as: DELTASONE Stopped by: Karys Meckley       TAKE these medications    albuterol (2.5 MG/3ML) 0.083% nebulizer solution Commonly known as: PROVENTIL 2.5 mg every four (4) hours as needed.   albuterol 108 (90 Base) MCG/ACT inhaler Commonly known as: VENTOLIN HFA Inhale 1-2 puffs into the lungs every 6 (six) hours as needed for wheezing or shortness of breath.   ascorbic Acid 500 MG Cpcr Commonly known as: VITAMIN C Take by mouth.   cetirizine  10 MG tablet Commonly known as: ZYRTEC Take 1 tablet (10 mg total) by mouth daily.   Cholecalciferol 25 MCG (1000 UT) tablet Take by mouth.   levothyroxine 50 MCG tablet Commonly known as: SYNTHROID Take 1 tablet (50 mcg total) by mouth daily. What changed:  medication strength how much to take Changed by: Broadus John Maziah Keeling   Multi-Vitamin tablet Take by mouth.   omeprazole 20 MG capsule Commonly known as: PRILOSEC Take 1 capsule (20 mg total) by mouth daily.   verapamil 80 MG tablet Commonly known as: CALAN Take 1 tablet (80 mg total) by mouth daily.         Follow-up: Return in about 6 months (around 04/26/2023).  Mechele Claude, M.D.

## 2022-11-07 ENCOUNTER — Other Ambulatory Visit: Payer: Self-pay | Admitting: Family Medicine

## 2022-11-30 DIAGNOSIS — Z01419 Encounter for gynecological examination (general) (routine) without abnormal findings: Secondary | ICD-10-CM | POA: Diagnosis not present

## 2022-11-30 DIAGNOSIS — Z1331 Encounter for screening for depression: Secondary | ICD-10-CM | POA: Diagnosis not present

## 2022-11-30 DIAGNOSIS — Z124 Encounter for screening for malignant neoplasm of cervix: Secondary | ICD-10-CM | POA: Diagnosis not present

## 2022-11-30 DIAGNOSIS — Z1231 Encounter for screening mammogram for malignant neoplasm of breast: Secondary | ICD-10-CM | POA: Diagnosis not present

## 2022-12-08 ENCOUNTER — Other Ambulatory Visit: Payer: Self-pay | Admitting: Family Medicine

## 2022-12-08 DIAGNOSIS — J4520 Mild intermittent asthma, uncomplicated: Secondary | ICD-10-CM

## 2022-12-10 LAB — HM PAP SMEAR

## 2022-12-17 ENCOUNTER — Encounter: Payer: Self-pay | Admitting: Family Medicine

## 2023-04-10 ENCOUNTER — Encounter: Payer: Self-pay | Admitting: Family Medicine

## 2023-04-10 ENCOUNTER — Ambulatory Visit: Payer: BC Managed Care – PPO | Admitting: Family Medicine

## 2023-04-10 VITALS — BP 132/92 | HR 85 | Temp 97.5°F | Ht 62.0 in | Wt 177.0 lb

## 2023-04-10 DIAGNOSIS — R051 Acute cough: Secondary | ICD-10-CM | POA: Diagnosis not present

## 2023-04-10 DIAGNOSIS — J3089 Other allergic rhinitis: Secondary | ICD-10-CM

## 2023-04-10 DIAGNOSIS — R0981 Nasal congestion: Secondary | ICD-10-CM

## 2023-04-10 LAB — VERITOR FLU A/B WAIVED
Influenza A: NEGATIVE
Influenza B: NEGATIVE

## 2023-04-10 MED ORDER — PREDNISONE 20 MG PO TABS
40.0000 mg | ORAL_TABLET | Freq: Every day | ORAL | 0 refills | Status: AC
Start: 2023-04-10 — End: 2023-04-15

## 2023-04-10 NOTE — Progress Notes (Signed)
Subjective:  Patient ID: Jodi Rios, female    DOB: Jul 13, 1960, 63 y.o.   MRN: [id number]  Patient Care Team: Mechele Claude, MD as PCP - General (Family Medicine)   Chief Complaint:  Nasal Congestion, Cough, Headache, and Chills (X 2-3 days )   HPI: Jodi Rios is a 63 y.o. female presenting on 04/10/2023 for Nasal Congestion, Cough, Headache, and Chills (X 2-3 days )  1. Acute cough States that symptoms started 3 days and feeling worse. States that she has chronic issues with sinuses. Reports headaches, congestion, facial pressure, cough (productive with phlegm), rhinorrhea, and dizziness. Denies teeth pain. Used inhaler twice yesterday.  Took tylenol twice yesterday, she is not taking anything else OTC. 2 weeks ago had GI symptoms. Has not completed home covid test. Endorses chills, but has not check temperature at home. Denies N/V.    Relevant past medical, surgical, family, and social history reviewed and updated as indicated.  Allergies and medications reviewed and updated. Data reviewed: Chart in Epic.   Past Medical History:  Diagnosis Date   Anemia    GERD (gastroesophageal reflux disease)    Mitral valve prolapse 1990   Thyroid disease     Past Surgical History:  Procedure Laterality Date   CESAREAN SECTION     CHOLECYSTECTOMY      Social History   Socioeconomic History   Marital status: Divorced    Spouse name: Not on file   Number of children: 2   Years of education: Not on file   Highest education level: Not on file  Occupational History   Occupation: AR15 Midwife: [employer]    Comment: 2nd shift  Tobacco Use   Smoking status: Never   Smokeless tobacco: Current  Vaping Use   Vaping status: Never Used  Substance and Sexual Activity   Alcohol use: Not Currently   Drug use: Never   Sexual activity: Not Currently  Other Topics Concern   Not on file  Social History Narrative   Patient is divorced.  She has 2 sons.   One son resides with her.  She has 3 grandchildren and 2 step grandchildren.   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   Social Drivers of Corporate investment banker Strain: Not on file  Food Insecurity: Not on file  Transportation Needs: Not on file  Physical Activity: Not on file  Stress: Not on file  Social Connections: Unknown (07/08/2021)   Received from [facility]   Social Network    Social Network: Not on file  Intimate Partner Violence: Unknown (05/30/2021)   Received from [facility], [facility]   HITS    Physically Hurt: Not on file    Insult or Talk Down To: Not on file    Threaten Physical Harm: Not on file    Scream or Curse: Not on file    Outpatient Encounter Medications as of 04/10/2023  Medication Sig   albuterol (PROVENTIL) (2.5 MG/3ML) 0.083% nebulizer solution 2.5 mg every four (4) hours as needed.   albuterol (VENTOLIN HFA) 108 (90 Base) MCG/ACT inhaler Inhale 1-2 puffs into the lungs every 6 (six) hours as needed for wheezing or shortness of breath.   ascorbic Acid (VITAMIN C) 500 MG CPCR Take by mouth.   cetirizine (ZYRTEC) 10 MG tablet Take 1 tablet (10 mg total) by mouth daily.   Cholecalciferol 25 MCG (1000 UT) tablet  Take by mouth.   levothyroxine (SYNTHROID) 50 MCG tablet Take 1 tablet (50 mcg total) by mouth daily.   Multiple Vitamin (MULTI-VITAMIN) tablet Take by mouth.   omeprazole (PRILOSEC) 20 MG capsule Take 1 capsule (20 mg total) by mouth daily.   verapamil (CALAN) 80 MG tablet Take 1 tablet (80 mg total) by mouth daily.   No facility-administered encounter medications on file as of 04/10/2023.    Allergies  Allergen Reactions   Codeine Other (See Comments)   Nitrofurantoin Other (See Comments)   Ofloxacin Other (See Comments)   Penicillins Other (See Comments)   Sulfamethoxazole Other (See Comments)    Review of Systems  Respiratory:  Positive for cough.   Neurological:  Positive for  headaches.   Objective:  BP (!) 132/92   Pulse 85   Temp (!) 97.5 F (36.4 C)   Ht 5\' 2"  (1.575 m)   Wt 177 lb (80.3 kg)   SpO2 96%   BMI 32.37 kg/m    Wt Readings from Last 3 Encounters:  04/10/23 177 lb (80.3 kg)  10/25/22 173 lb 6.4 oz (78.7 kg)  09/18/22 172 lb (78 kg)   Physical Exam Constitutional:      General: She is awake. She is not in acute distress.    Appearance: Normal appearance. She is well-developed and well-groomed. She is ill-appearing. She is not toxic-appearing or diaphoretic.  HENT:     Right Ear: A middle ear effusion is present. No hemotympanum. Tympanic membrane is not injected, scarred, perforated, erythematous, retracted or bulging.     Left Ear: A middle ear effusion is present. No hemotympanum. Tympanic membrane is not injected, scarred, perforated, erythematous, retracted or bulging.     Nose: Congestion and rhinorrhea present. Rhinorrhea is clear.     Right Sinus: Maxillary sinus tenderness and frontal sinus tenderness present.     Left Sinus: Maxillary sinus tenderness and frontal sinus tenderness present.     Mouth/Throat:     Lips: Pink. No lesions.     Mouth: Mucous membranes are moist.     Palate: No mass.     Pharynx: No pharyngeal swelling.     Tonsils: No tonsillar exudate.  Cardiovascular:     Rate and Rhythm: Normal rate and regular rhythm.     Pulses: Normal pulses.          Radial pulses are 2+ on the right side and 2+ on the left side.       Posterior tibial pulses are 2+ on the right side and 2+ on the left side.     Heart sounds: Normal heart sounds. No murmur heard.    No gallop.  Pulmonary:     Effort: Pulmonary effort is normal. No respiratory distress.     Breath sounds: Normal breath sounds. No stridor. No wheezing, rhonchi or rales.  Musculoskeletal:     Cervical back: Full passive range of motion without pain and neck supple.     Right lower leg: No edema.     Left lower leg: No edema.  Skin:    General: Skin is  warm.     Capillary Refill: Capillary refill takes less than 2 seconds.     Coloration: Skin is pale.  Neurological:     General: No focal deficit present.     Mental Status: She is alert, oriented to person, place, and time and easily aroused. Mental status is at baseline.     GCS: GCS eye subscore is 4. GCS  verbal subscore is 5. GCS motor subscore is 6.     Motor: No weakness.  Psychiatric:        Attention and Perception: Attention and perception normal.        Mood and Affect: Mood and affect normal.        Speech: Speech normal.        Behavior: Behavior normal. Behavior is cooperative.        Thought Content: Thought content normal. Thought content does not include homicidal or suicidal ideation. Thought content does not include homicidal or suicidal plan.        Cognition and Memory: Cognition and memory normal.        Judgment: Judgment normal.    Results for orders placed or performed in visit on 01/15/23  HM PAP SMEAR   Collection Time: 11/30/22 12:00 AM  Result Value Ref Range   HM Pap smear NILM        10/25/2022    3:17 PM 09/18/2022   11:46 AM 06/25/2022   10:36 AM 10/23/2021    2:01 PM 04/25/2021    3:36 PM  Depression screen PHQ 2/9  Decreased Interest 0 0 0 0 0  Down, Depressed, Hopeless 0 0 0 0 0  PHQ - 2 Score 0 0 0 0 0  Altered sleeping   0  0  Tired, decreased energy   0  0  Change in appetite   0  0  Feeling bad or failure about yourself    0  0  Trouble concentrating   0  0  Moving slowly or fidgety/restless   0  0  Suicidal thoughts   0  0  PHQ-9 Score   0  0  Difficult doing work/chores   Not difficult at all  Not difficult at all       06/25/2022   10:36 AM 04/25/2021    3:36 PM 10/19/2020    8:10 AM  GAD 7 : Generalized Anxiety Score  Nervous, Anxious, on Edge 0 0 0  Control/stop worrying 0 0 0  Worry too much - different things 0 0 0  Trouble relaxing 0 0 0  Restless 0 0 0  Easily annoyed or irritable 0 0 0  Afraid - awful might happen 0  0 0  Total GAD 7 Score 0 0 0  Anxiety Difficulty Not difficult at all  Not difficult at all   Pertinent labs & imaging results that were available during my care of the patient were reviewed by me and considered in my medical decision making.  Assessment & Plan:  Shrinika "Fannie Knee" was seen today for nasal congestion, cough, headache and chills.  Diagnoses and all orders for this visit:  1. Acute cough (Primary) Discussed with patient that likely viral in etiology given timing of symptoms. Negative rapid flu in office. Will await other lab results. States that she has cough syrup at home. Declines tessalon perles.  - Veritor Flu A/B Waived - COVID-19, Flu A+B and RSV  2. Sinus congestion Given history of chronic sinusitis, will start medication as below. Encouraged patient to continue at home therapy such as increasing hydration, using humidifier.  - predniSONE (DELTASONE) 20 MG tablet; Take 2 tablets (40 mg total) by mouth daily with breakfast for 5 days.  Dispense: 10 tablet; Refill: 0  3. Non-seasonal allergic rhinitis, unspecified trigger As above.   Continue all other maintenance medications.  Follow up plan: Return if symptoms worsen or fail to improve.  Continue healthy lifestyle choices, including diet (rich in fruits, vegetables, and lean proteins, and low in salt and simple carbohydrates) and exercise (at least 30 minutes of moderate physical activity daily).  Written and verbal instructions provided   The above assessment and management plan was discussed with the patient. The patient verbalized understanding of and has agreed to the management plan. Patient is aware to call the clinic if they develop any new symptoms or if symptoms persist or worsen. Patient is aware when to return to the clinic for a follow-up visit. Patient educated on when it is appropriate to go to the emergency department.   Neale Burly, DNP-FNP [facility] [address] [place], [place] [address] [phone]

## 2023-04-12 LAB — COVID-19, FLU A+B AND RSV

## 2023-04-15 ENCOUNTER — Encounter: Payer: Self-pay | Admitting: Family Medicine

## 2023-04-15 NOTE — Progress Notes (Signed)
 Negative on rapid tests. Send out was not able to collect. Recommend patient follow up if symptoms continue or do not improve.

## 2023-04-16 ENCOUNTER — Telehealth: Payer: Self-pay | Admitting: Family Medicine

## 2023-04-16 NOTE — Telephone Encounter (Signed)
 Copied from CRM [id number]. Topic: Clinical - Lab/Test Results >> Apr 16, 2023  2:48 PM Phill Myron wrote: Patient said she already read her results but is disappointed she is just now getting a call back.

## 2023-04-19 ENCOUNTER — Other Ambulatory Visit: Payer: BC Managed Care – PPO

## 2023-04-23 ENCOUNTER — Other Ambulatory Visit: Payer: Self-pay | Admitting: Family Medicine

## 2023-04-24 ENCOUNTER — Ambulatory Visit: Payer: BC Managed Care – PPO | Admitting: Family Medicine

## 2023-05-03 ENCOUNTER — Encounter: Payer: Self-pay | Admitting: Family Medicine

## 2023-05-03 ENCOUNTER — Ambulatory Visit: Admitting: Family Medicine

## 2023-05-03 VITALS — BP 131/80 | HR 93 | Temp 98.0°F | Ht 62.0 in | Wt 178.0 lb

## 2023-05-03 DIAGNOSIS — J4521 Mild intermittent asthma with (acute) exacerbation: Secondary | ICD-10-CM | POA: Diagnosis not present

## 2023-05-03 DIAGNOSIS — J0140 Acute pansinusitis, unspecified: Secondary | ICD-10-CM

## 2023-05-03 MED ORDER — PREDNISONE 20 MG PO TABS
40.0000 mg | ORAL_TABLET | Freq: Every day | ORAL | 0 refills | Status: AC
Start: 2023-05-03 — End: 2023-05-08

## 2023-05-03 MED ORDER — DOXYCYCLINE HYCLATE 100 MG PO TABS
100.0000 mg | ORAL_TABLET | Freq: Two times a day (BID) | ORAL | 0 refills | Status: AC
Start: 2023-05-03 — End: 2023-05-10

## 2023-05-03 NOTE — Progress Notes (Signed)
 Acute Office Visit  Subjective:     Patient ID: Jodi Rios, female    DOB: Oct 30, 1960, 63 y.o.   MRN: [id number]  Chief Complaint  Patient presents with   Cough    Cough This is a new problem. Episode onset: 3-4 weeks. Progression since onset: cough waxes and wanes, sinus pressure worsening. The cough is Productive of sputum (yellow). Associated symptoms include chills, ear congestion, headaches (sinus pressure), nasal congestion, rhinorrhea and shortness of breath (sometimes with coughing fit). Pertinent negatives include no chest pain, ear pain, fever or sore throat. The symptoms are aggravated by fumes and exercise. She has tried a beta-agonist inhaler and oral steroids for the symptoms. The treatment provided mild relief. Her past medical history is significant for asthma. There is no history of COPD or pneumonia.     Review of Systems  Constitutional:  Positive for chills. Negative for fever.  HENT:  Positive for rhinorrhea. Negative for ear pain and sore throat.   Respiratory:  Positive for cough and shortness of breath (sometimes with coughing fit).   Cardiovascular:  Negative for chest pain.  Neurological:  Positive for headaches (sinus pressure).        Objective:    BP 131/80   Pulse 93   Temp 98 F (36.7 C) (Temporal)   Ht 5\' 2"  (1.575 m)   Wt 178 lb (80.7 kg)   SpO2 94%   BMI 32.56 kg/m    Physical Exam Vitals and nursing note reviewed.  Constitutional:      General: She is not in acute distress.    Appearance: She is not ill-appearing, toxic-appearing or diaphoretic.  HENT:     Right Ear: Tympanic membrane, ear canal and external ear normal.     Left Ear: Tympanic membrane, ear canal and external ear normal.     Nose: Congestion present.     Right Sinus: Maxillary sinus tenderness and frontal sinus tenderness present.     Left Sinus: Maxillary sinus tenderness and frontal sinus tenderness present.     Mouth/Throat:     Mouth: Mucous membranes are  moist.     Pharynx: Oropharynx is clear. No oropharyngeal exudate or posterior oropharyngeal erythema.  Cardiovascular:     Rate and Rhythm: Normal rate and regular rhythm.     Heart sounds: Normal heart sounds. No murmur heard. Pulmonary:     Effort: Pulmonary effort is normal. No respiratory distress.     Breath sounds: Normal breath sounds. No wheezing, rhonchi or rales.  Musculoskeletal:     Right lower leg: No edema.     Left lower leg: No edema.  Skin:    General: Skin is warm and dry.  Neurological:     General: No focal deficit present.     Mental Status: She is alert and oriented to person, place, and time.  Psychiatric:        Mood and Affect: Mood normal.        Behavior: Behavior normal.     No results found for any visits on 05/03/23.      Assessment & Plan:   Phillippa "Fannie Knee" was seen today for cough.  Diagnoses and all orders for this visit:  Acute non-recurrent pansinusitis Doxycyline as below.  -     doxycycline (VIBRA-TABS) 100 MG tablet; Take 1 tablet (100 mg total) by mouth 2 (two) times daily for 7 days.  Mild intermittent asthma with acute exacerbation Prednisone burst as below. Albuterol prn.  -  predniSONE (DELTASONE) 20 MG tablet; Take 2 tablets (40 mg total) by mouth daily with breakfast for 5 days.  Discussed symptomatic care and return precautions.   The patient indicates understanding of these issues and agrees with the plan.    Gabriel Earing, FNP

## 2023-06-18 ENCOUNTER — Other Ambulatory Visit: Payer: Self-pay | Admitting: Family Medicine

## 2023-06-18 DIAGNOSIS — J4520 Mild intermittent asthma, uncomplicated: Secondary | ICD-10-CM

## 2023-07-02 ENCOUNTER — Ambulatory Visit: Admitting: Nurse Practitioner

## 2023-07-02 ENCOUNTER — Encounter: Payer: Self-pay | Admitting: Nurse Practitioner

## 2023-07-02 ENCOUNTER — Ambulatory Visit: Admitting: Family Medicine

## 2023-07-02 VITALS — BP 141/81 | HR 83 | Temp 95.6°F | Ht 62.0 in | Wt 178.0 lb

## 2023-07-02 DIAGNOSIS — J0101 Acute recurrent maxillary sinusitis: Secondary | ICD-10-CM

## 2023-07-02 MED ORDER — CHLORPHEN-PE-ACETAMINOPHEN 4-10-325 MG PO TABS: 1.0000 | ORAL_TABLET | Freq: Four times a day (QID) | ORAL | 0 refills | Status: AC | PRN

## 2023-07-02 MED ORDER — DOXYCYCLINE HYCLATE 100 MG PO TABS: 100.0000 mg | ORAL_TABLET | Freq: Two times a day (BID) | ORAL | 0 refills | Status: DC

## 2023-07-02 NOTE — Progress Notes (Signed)
 Subjective:    Patient ID: Jodi Rios, female    DOB: 1960-05-13, 63 y.o.   MRN: [id number]   Chief Complaint: sinusitis  Sinusitis This is a new problem. The current episode started in the past 7 days. The problem has been waxing and waning since onset. The maximum temperature recorded prior to her arrival was 100.4 - 100.9 F. The fever has been present for 1 to 2 days. Her pain is at a severity of 5/10. The pain is mild. Associated symptoms include congestion, headaches, sinus pressure and sneezing. Pertinent negatives include no chills, coughing or sore throat. Treatments tried: tylenol sinus, zyrtec . The treatment provided mild relief.    Patient Active Problem List   Diagnosis Date Noted   MVP (mitral valve prolapse) 10/25/2022   Hypothyroidism due to acquired atrophy of thyroid  04/30/2019   Heart palpitations 04/30/2019   Gastroesophageal reflux disease without esophagitis 04/30/2019   Non-seasonal allergic rhinitis 04/30/2019   Low bone mass 04/30/2019       Review of Systems  Constitutional:  Positive for fever (resolved). Negative for chills.  HENT:  Positive for congestion, sinus pressure and sneezing. Negative for sore throat.   Respiratory:  Negative for cough.   Neurological:  Positive for headaches.       Objective:   Physical Exam Constitutional:      Appearance: Normal appearance.  HENT:     Right Ear: Tympanic membrane normal.     Left Ear: Tympanic membrane normal.     Nose: Congestion present.     Right Sinus: Maxillary sinus tenderness present.     Left Sinus: Maxillary sinus tenderness present.     Mouth/Throat:     Mouth: Mucous membranes are moist.     Pharynx: No oropharyngeal exudate or posterior oropharyngeal erythema.  Cardiovascular:     Rate and Rhythm: Normal rate and regular rhythm.     Heart sounds: Normal heart sounds.  Pulmonary:     Effort: Pulmonary effort is normal.     Breath sounds: Normal breath sounds.  Musculoskeletal:      Cervical back: Normal range of motion and neck supple.  Skin:    General: Skin is warm.  Neurological:     General: No focal deficit present.     Mental Status: She is alert and oriented to person, place, and time.  Psychiatric:        Mood and Affect: Mood normal.        Behavior: Behavior normal.    BP (!) 141/81   Pulse 83   Temp (!) 95.6 F (35.3 C) (Skin)   Ht 5\' 2"  (1.575 m)   Wt 178 lb (80.7 kg)   BMI 32.56 kg/m         Assessment & Plan:   Jodi Rios in today with chief complaint of Sinusitis   1. Acute recurrent maxillary sinusitis (Primary) 1. Take meds as prescribed 2. Use a cool mist humidifier especially during the winter months and when heat has been humid. 3. Use saline nose sprays frequently 4. Saline irrigations of the nose can be very helpful if done frequently.  * 4X daily for 1 week*  * Use of a nettie pot can be helpful with this. Follow directions with this* 5. Drink plenty of fluids 6. Keep thermostat turn down low 7.For any cough or congestion- norel ad 8. For fever or aces or pains- take tylenol or ibuprofen appropriate for age and weight.  * for fevers greater  than 101 orally you may alternate ibuprofen and tylenol every  3 hours.   Meds ordered this encounter  Medications   doxycycline  (VIBRA -TABS) 100 MG tablet    Sig: Take 1 tablet (100 mg total) by mouth 2 (two) times daily. 1 po bid    Dispense:  28 tablet    Refill:  0    Supervising Provider:   Jolyne Needs A [[id number]]   Chlorphen-PE-Acetaminophen 4-10-325 MG TABS    Sig: Take 1 tablet by mouth every 6 (six) hours as needed.    Dispense:  20 tablet    Refill:  0    Supervising Provider:   Jolyne Needs A [[id number]]       The above assessment and management plan was discussed with the patient. The patient verbalized understanding of and has agreed to the management plan. Patient is aware to call the clinic if symptoms persist or worsen. Patient is aware when  to return to the clinic for a follow-up visit. Patient educated on when it is appropriate to go to the emergency department.   Mary-Margaret Gaylyn Keas, FNP

## 2023-07-02 NOTE — Patient Instructions (Addendum)
1. Take meds as prescribed 2. Use a cool mist humidifier especially during the winter months and when heat has been humid. 3. Use saline nose sprays frequently 4. Saline irrigations of the nose can be very helpful if done frequently.  * 4X daily for 1 week*  * Use of a nettie pot can be helpful with this. Follow directions with this* 5. Drink plenty of fluids 6. Keep thermostat turn down low 7.For any cough or congestion- norel ad 8. For fever or aces or pains- take tylenol or ibuprofen appropriate for age and weight.  * for fevers greater than 101 orally you may alternate ibuprofen and tylenol every  3 hours.    

## 2023-07-04 ENCOUNTER — Encounter (HOSPITAL_COMMUNITY): Payer: Self-pay

## 2023-07-05 ENCOUNTER — Other Ambulatory Visit

## 2023-07-05 DIAGNOSIS — E034 Atrophy of thyroid (acquired): Secondary | ICD-10-CM | POA: Diagnosis not present

## 2023-07-06 LAB — THYROID PANEL WITH TSH
Free Thyroxine Index: 3.5 (ref 1.2–4.9)
T3 Uptake Ratio: 29 % (ref 24–39)
T4, Total: 11.9 ug/dL (ref 4.5–12.0)
TSH: 2.38 u[IU]/mL (ref 0.450–4.500)

## 2023-07-07 ENCOUNTER — Encounter: Payer: Self-pay | Admitting: Family Medicine

## 2023-07-07 NOTE — Progress Notes (Signed)
Hello Lyrica,  Your lab result is normal and/or stable.Some minor variations that are not significant are commonly marked abnormal, but do not represent any medical problem for you.  Best regards, Claretta Fraise, M.D.

## 2023-07-11 ENCOUNTER — Encounter: Payer: Self-pay | Admitting: Family Medicine

## 2023-07-11 ENCOUNTER — Ambulatory Visit: Admitting: Family Medicine

## 2023-07-11 DIAGNOSIS — J4540 Moderate persistent asthma, uncomplicated: Secondary | ICD-10-CM

## 2023-07-11 MED ORDER — VERAPAMIL HCL 80 MG PO TABS: 80.0000 mg | ORAL_TABLET | Freq: Every day | ORAL | 3 refills | Status: AC

## 2023-07-11 MED ORDER — LEVOTHYROXINE SODIUM 50 MCG PO TABS: 50.0000 ug | ORAL_TABLET | Freq: Every day | ORAL | 1 refills | Status: DC

## 2023-07-11 MED ORDER — ALBUTEROL SULFATE HFA 108 (90 BASE) MCG/ACT IN AERS: 1.0000 | INHALATION_SPRAY | Freq: Four times a day (QID) | RESPIRATORY_TRACT | 3 refills | Status: AC | PRN

## 2023-07-11 MED ORDER — OMEPRAZOLE 40 MG PO CPDR: 40.0000 mg | DELAYED_RELEASE_CAPSULE | Freq: Every day | ORAL | 3 refills | Status: AC

## 2023-07-11 NOTE — Progress Notes (Signed)
 Subjective:  Patient ID: Jodi Rios, female    DOB: 12-30-1960  Age: 63 y.o. MRN: [id number]  CC: Medication Refill (Medication pended.) and Gastroesophageal Reflux (Pt has been vomitting sometimes from acid reflux. )   HPI Jodi Rios presents for awakening at night with reflux and throws up.  She also notes that she has excessive hunger at times and then other times she will just feel weak and achy or dizzy.  Of note is that she takes her thyroid  pill and her omeprazole  with food.  Patient continues to take verapamil  for mitral valve prolapse symptoms and has not had any significant palpitations or chest pains or shortness of breath.  F patient presents for follow-up on  thyroid . The patient has a history of hypothyroidism for many years. It has been stable recently. Pt. denies any change in  voice, loss of hair, heat or cold intolerance.  Energy level has been questionable see above.  Patient denies constipation and diarrhea. No myxedema. Medication is as noted below. V noted that she is not taking it on an empty stomach  Asthma stable on current inhaler regimen      05/03/2023   11:00 AM 10/25/2022    3:17 PM 09/18/2022   11:46 AM  Depression screen PHQ 2/9  Decreased Interest 0 0 0  Down, Depressed, Hopeless 0 0 0  PHQ - 2 Score 0 0 0  Altered sleeping 0    Tired, decreased energy 0    Change in appetite 0    Feeling bad or failure about yourself  0    Trouble concentrating 0    Moving slowly or fidgety/restless 0    Suicidal thoughts 0    PHQ-9 Score 0    Difficult doing work/chores Not difficult at all      History Jodi Rios has a past medical history of Anemia, GERD (gastroesophageal reflux disease), Mitral valve prolapse (1990), and Thyroid  disease.   She has a past surgical history that includes Cholecystectomy and Cesarean section.   Her family history includes Cervical cancer in her sister; Diabetes in her son; Hyperlipidemia in her father; Multiple sclerosis in  her sister; Stroke in her mother; Thyroid  disease in her mother.She reports that she has never smoked. She uses smokeless tobacco. She reports that she does not currently use alcohol. She reports that she does not use drugs.    ROS Review of Systems  Constitutional: Negative.   HENT: Negative.    Eyes:  Negative for visual disturbance.  Respiratory:  Negative for shortness of breath.   Cardiovascular:  Negative for chest pain.  Gastrointestinal:  Negative for abdominal pain.  Musculoskeletal:  Negative for arthralgias.    Objective:  BP 120/80   Pulse 91   Temp 98.2 F (36.8 C)   Ht 5\' 2"  (1.575 m)   Wt 178 lb (80.7 kg)   SpO2 100%   BMI 32.56 kg/m   BP Readings from Last 3 Encounters:  07/11/23 120/80  07/02/23 (!) 141/81  05/03/23 131/80    Wt Readings from Last 3 Encounters:  07/11/23 178 lb (80.7 kg)  07/02/23 178 lb (80.7 kg)  05/03/23 178 lb (80.7 kg)     Physical Exam Constitutional:      General: She is not in acute distress.    Appearance: She is well-developed. She is obese.  Cardiovascular:     Rate and Rhythm: Normal rate and regular rhythm.  Pulmonary:     Breath sounds: Normal breath sounds.  Musculoskeletal:        General: Normal range of motion.  Skin:    General: Skin is warm and dry.  Neurological:     Mental Status: She is alert and oriented to person, place, and time.    Results for orders placed or performed in visit on 07/05/23  Thyroid  Panel With TSH   Collection Time: 07/05/23  8:28 AM  Result Value Ref Range   TSH 2.380 0.450 - 4.500 uIU/mL   T4, Total 11.9 4.5 - 12.0 ug/dL   T3 Uptake Ratio 29 24 - 39 %   Free Thyroxine Index 3.5 1.2 - 4.9      Assessment & Plan:  Moderate persistent asthma, unspecified whether complicated -     Albuterol  Sulfate HFA; Inhale 1-2 puffs into the lungs every 6 (six) hours as needed for wheezing or shortness of breath.  Dispense: 8 g; Refill: 3  Other orders -     Verapamil  HCl; Take 1  tablet (80 mg total) by mouth daily.  Dispense: 90 tablet; Refill: 3 -     Levothyroxine  Sodium; Take 1 tablet (50 mcg total) by mouth daily. Must take on an empty stomach.  Dispense: 90 tablet; Refill: 1 -     Omeprazole ; Take 1 capsule (40 mg total) by mouth daily. Must take on an empty stomach  Dispense: 90 capsule; Refill: 3     Follow-up: Return in about 4 months (around 11/11/2023) for [facility] physical.  Roise Cleaver, M.D.

## 2023-08-26 ENCOUNTER — Encounter: Payer: Self-pay | Admitting: Family Medicine

## 2023-08-26 ENCOUNTER — Ambulatory Visit: Admitting: Family Medicine

## 2023-08-26 ENCOUNTER — Ambulatory Visit: Payer: Self-pay

## 2023-08-26 VITALS — BP 117/73 | HR 85 | Temp 98.2°F | Ht 62.0 in | Wt 179.0 lb

## 2023-08-26 DIAGNOSIS — J0100 Acute maxillary sinusitis, unspecified: Secondary | ICD-10-CM

## 2023-08-26 MED ORDER — DOXYCYCLINE HYCLATE 100 MG PO CAPS
100.0000 mg | ORAL_CAPSULE | Freq: Two times a day (BID) | ORAL | 0 refills | Status: DC
Start: 2023-08-26 — End: 2023-12-06

## 2023-08-26 MED ORDER — PSEUDOEPHEDRINE-GUAIFENESIN ER 120-1200 MG PO TB12: 1.0000 | ORAL_TABLET | Freq: Two times a day (BID) | ORAL | 99 refills | Status: AC

## 2023-08-26 NOTE — Progress Notes (Signed)
 Chief Complaint  Patient presents with   Sinusitis    Allergies are bad all the time but now it is in her chest. Green and yellow mucous for the past few days. Tight in chest and having to use inhaler.    HPI  Patient presents today for Symptoms include congestion, facial pain, nasal congestion, non productive cough, post nasal drip and sinus pressure. There is no fever, chills, or sweats. Onset of symptoms was a few days ago, gradually worsening since that time.    PMH: Smoking status noted Review of Systems  Objective: BP 117/73   Pulse 85   Temp 98.2 F (36.8 C)   Ht 5' 2 (1.575 m)   Wt 179 lb (81.2 kg)   SpO2 94%   BMI 32.74 kg/m  Gen: NAD, alert, cooperative with exam HEENT: NCAT, EOMI, PERRL CV: RRR, good S1/S2, no murmur Resp: CTABL, no wheezes, non-labored Abd: SNTND, BS present, no guarding or organomegaly Ext: No edema, warm Neuro: Alert and oriented, No gross deficits  Acute maxillary sinusitis, recurrence not specified  Other orders -     Doxycycline  Hyclate; Take 1 capsule (100 mg total) by mouth 2 (two) times daily.  Dispense: 40 capsule; Refill: 0 -     Pseudoephedrine-guaiFENesin  ER; Take 1 tablet by mouth 2 (two) times daily. For congestion  Dispense: 16 tablet; Refill: PRN

## 2023-08-26 NOTE — Telephone Encounter (Signed)
 FYI Only or Action Required?: FYI only for provider.  Patient was last seen in primary care on 07/11/2023 by Zollie Lowers, MD. Called Nurse Triage reporting Sinusitis. Symptoms began several days ago. Interventions attempted: OTC medications: Inhaler and OTC Tylenol  Sinus medication. Symptoms are: gradually worsening.  Triage Disposition: See HCP Within 4 Hours (Or PCP Triage)  Patient/caregiver understands and will follow disposition?: Yes   Copied from CRM [id number]. Topic: Clinical - Red Word Triage >> Aug 26, 2023  8:42 AM Kevelyn M wrote: Red Word that prompted transfer to Nurse Triage: Sinus congestion and green mucus Reason for Disposition  [1] SEVERE pain AND [2] not improved 2 hours after pain medicine  Answer Assessment - Initial Assessment Questions 1. LOCATION: Where does it hurt?      Patient states she has sinus pain but has been taking OTC tylenol  sinus medication 2. ONSET: When did the sinus pain start?  (e.g., hours, days)     A couple of days 3. SEVERITY: How bad is the pain?   (Scale 1-10; mild, moderate or severe)   - MILD (1-3): doesn't interfere with normal activities    - MODERATE (4-7): interferes with normal activities (e.g., work or school) or awakens from sleep   - SEVERE (8-10): excruciating pain and patient unable to do any normal activities        8/10 4. RECURRENT SYMPTOM: Have you ever had sinus problems before? If Yes, ask: When was the last time? and What happened that time?      Yes - states she has allergies 5. NASAL CONGESTION: Is the nose blocked? If Yes, ask: Can you open it or must you breathe through your mouth?     Yes 6. NASAL DISCHARGE: Do you have discharge from your nose? If so ask, What color?     Green mucus 7. FEVER: Do you have a fever? If Yes, ask: What is it, how was it measured, and when did it start?      No but gets chills 8. OTHER SYMPTOMS: Do you have any other symptoms? (e.g., sore throat, cough,  earache, difficulty breathing)     Headache, 'sick to stomach', cough, ear pressure  Protocols used: Sinus Pain or Congestion-A-AH

## 2023-09-17 ENCOUNTER — Other Ambulatory Visit: Payer: Self-pay | Admitting: Family Medicine

## 2023-09-17 DIAGNOSIS — J4520 Mild intermittent asthma, uncomplicated: Secondary | ICD-10-CM

## 2023-10-18 ENCOUNTER — Telehealth: Payer: Self-pay | Admitting: Family Medicine

## 2023-10-18 NOTE — Telephone Encounter (Signed)
 Noted. Will add future lab orders on Monday.

## 2023-10-18 NOTE — Telephone Encounter (Signed)
 Patient has appt 8-29 for labs and CPE with [facility] on 9-2. Please add orders.

## 2023-10-21 ENCOUNTER — Other Ambulatory Visit: Payer: Self-pay | Admitting: *Deleted

## 2023-10-21 DIAGNOSIS — E034 Atrophy of thyroid (acquired): Secondary | ICD-10-CM

## 2023-10-21 DIAGNOSIS — Z0000 Encounter for general adult medical examination without abnormal findings: Secondary | ICD-10-CM

## 2023-10-25 ENCOUNTER — Other Ambulatory Visit

## 2023-10-25 DIAGNOSIS — Z1329 Encounter for screening for other suspected endocrine disorder: Secondary | ICD-10-CM | POA: Diagnosis not present

## 2023-10-25 DIAGNOSIS — Z0000 Encounter for general adult medical examination without abnormal findings: Secondary | ICD-10-CM

## 2023-10-25 DIAGNOSIS — E034 Atrophy of thyroid (acquired): Secondary | ICD-10-CM | POA: Diagnosis not present

## 2023-10-26 LAB — CMP14+EGFR
ALT: 24 IU/L (ref 0–32)
AST: 23 IU/L (ref 0–40)
Albumin: 4 g/dL (ref 3.9–4.9)
Alkaline Phosphatase: 175 IU/L — ABNORMAL HIGH (ref 44–121)
BUN/Creatinine Ratio: 23 (ref 12–28)
BUN: 15 mg/dL (ref 8–27)
Bilirubin Total: 0.7 mg/dL (ref 0.0–1.2)
CO2: 24 mmol/L (ref 20–29)
Calcium: 9.2 mg/dL (ref 8.7–10.3)
Chloride: 105 mmol/L (ref 96–106)
Creatinine, Ser: 0.65 mg/dL (ref 0.57–1.00)
Globulin, Total: 2.1 g/dL (ref 1.5–4.5)
Glucose: 99 mg/dL (ref 70–99)
Potassium: 4.2 mmol/L (ref 3.5–5.2)
Sodium: 142 mmol/L (ref 134–144)
Total Protein: 6.1 g/dL (ref 6.0–8.5)
eGFR: 99 mL/min/1.73 (ref >59)

## 2023-10-26 LAB — THYROID PANEL WITH TSH
Free Thyroxine Index: 2.7 (ref 1.2–4.9)
T3 Uptake Ratio: 25 % (ref 24–39)
T4, Total: 10.8 ug/dL (ref 4.5–12.0)
TSH: 2.13 u[IU]/mL (ref 0.450–4.500)

## 2023-10-26 LAB — CBC WITH DIFFERENTIAL/PLATELET
Basophils Absolute: 0.1 x10E3/uL (ref 0.0–0.2)
Basos: 1 %
EOS (ABSOLUTE): 0.2 x10E3/uL (ref 0.0–0.4)
Eos: 4 %
Hematocrit: 46.7 % — ABNORMAL HIGH (ref 34.0–46.6)
Hemoglobin: 15.2 g/dL (ref 11.1–15.9)
Immature Grans (Abs): 0 x10E3/uL (ref 0.0–0.1)
Immature Granulocytes: 0 %
Lymphocytes Absolute: 1.5 x10E3/uL (ref 0.7–3.1)
Lymphs: 24 %
MCH: 29.5 pg (ref 26.6–33.0)
MCHC: 32.5 g/dL (ref 31.5–35.7)
MCV: 91 fL (ref 79–97)
Monocytes Absolute: 0.8 x10E3/uL (ref 0.1–0.9)
Monocytes: 12 %
Neutrophils Absolute: 3.8 x10E3/uL (ref 1.4–7.0)
Neutrophils: 58 %
Platelets: 299 x10E3/uL (ref 150–450)
RBC: 5.16 x10E6/uL (ref 3.77–5.28)
RDW: 12.3 % (ref 11.7–15.4)
WBC: 6.4 x10E3/uL (ref 3.4–10.8)

## 2023-10-26 LAB — LIPID PANEL
Chol/HDL Ratio: 2.7 ratio (ref 0.0–4.4)
Cholesterol, Total: 136 mg/dL (ref 100–199)
HDL: 51 mg/dL (ref >39)
LDL Chol Calc (NIH): 73 mg/dL (ref 0–99)
Triglycerides: 57 mg/dL (ref 0–149)
VLDL Cholesterol Cal: 12 mg/dL (ref 5–40)

## 2023-10-29 ENCOUNTER — Encounter: Payer: Self-pay | Admitting: Family Medicine

## 2023-10-29 ENCOUNTER — Ambulatory Visit (INDEPENDENT_AMBULATORY_CARE_PROVIDER_SITE_OTHER): Payer: BC Managed Care – PPO | Admitting: Family Medicine

## 2023-10-29 VITALS — BP 118/75 | HR 97 | Temp 98.7°F | Ht 62.0 in | Wt 178.0 lb

## 2023-10-29 DIAGNOSIS — R748 Abnormal levels of other serum enzymes: Secondary | ICD-10-CM | POA: Diagnosis not present

## 2023-10-29 DIAGNOSIS — Z0001 Encounter for general adult medical examination with abnormal findings: Secondary | ICD-10-CM | POA: Diagnosis not present

## 2023-10-29 DIAGNOSIS — Z0000 Encounter for general adult medical examination without abnormal findings: Secondary | ICD-10-CM

## 2023-10-29 NOTE — Progress Notes (Signed)
 Subjective:  Patient ID: Jodi Rios, female    DOB: [birth year]  Age: 63 y.o. MRN: [id number]  CC: Annual Exam   HPI  Discussed the use of AI scribe software for clinical note transcription with the patient, who gave verbal consent to proceed.  History of Present Illness Jodi Rios is a 63 year old female who presents for complete physical. She prefers to not undress and checks herself for moles except her back. She has regular GYN exams as well. She reports hand pain and elevated alkaline phosphatase levels.  She experiences intermittent aches and pains in her fingers, which are sore and sometimes hurt. She uses her hands extensively for work and takes ibuprofen or Tylenol  only when the pain is severe, preferring Tylenol  when necessary.  She recalls a possible lipoma in her right forearm, approximately 3x3 cm in size, located on the backside of her right forearm, which has not changed over time and has not caused any issues. Additionally, she has a cyst-like formation on the back of her leg, which has remained unchanged.  She has noticed elevated alkaline phosphatase levels in her blood work over the years, with the most recent level being 175, higher than previous readings. She is concerned about this finding. She experiences arthritis in her hands.  No hearing problems despite some wax in her right ear, which occasionally itches.  She works at [employer], walking 12 hours a day, and expresses financial concerns about retiring due to [employer]. She recently turned 59 and is considering Medicare options but finds them expensive. No significant issues with her hips, knees, or ankles, although they sometimes feel like they want to give out, which she attributes to aging and her physically demanding job.          10/29/2023    8:23 AM 08/26/2023    2:39 PM 05/03/2023   11:00 AM  Depression screen PHQ 2/9  Decreased Interest 0 0 0  Down, Depressed, Hopeless 0 0 0  PHQ  - 2 Score 0 0 0  Altered sleeping  0 0  Tired, decreased energy  0 0  Change in appetite  0 0  Feeling bad or failure about yourself   0 0  Trouble concentrating  0 0  Moving slowly or fidgety/restless  0 0  Suicidal thoughts  0 0  PHQ-9 Score  0 0  Difficult doing work/chores  Not difficult at all Not difficult at all    History Jodi Rios has a past medical history of Anemia, GERD (gastroesophageal reflux disease), Mitral valve prolapse (1990), and Thyroid  disease.   She has a past surgical history that includes Cholecystectomy and Cesarean section.   Her family history includes Cervical cancer in her sister; Diabetes in her son; Hyperlipidemia in her father; Multiple sclerosis in her sister; Stroke in her mother; Thyroid  disease in her mother.She reports that she has never smoked. She uses smokeless tobacco. She reports that she does not currently use alcohol. She reports that she does not use drugs.    ROS Review of Systems  Constitutional: Negative.   HENT:  Negative for congestion.   Eyes:  Negative for visual disturbance.  Respiratory:  Negative for shortness of breath.   Cardiovascular:  Negative for chest pain.  Gastrointestinal:  Negative for abdominal pain, constipation, diarrhea, nausea and vomiting.  Genitourinary:  Negative for difficulty urinating.  Musculoskeletal:  Positive for arthralgias. Negative for myalgias.  Neurological:  Negative for headaches.  Psychiatric/Behavioral:  Negative  for sleep disturbance.     Objective:  BP 118/75   Pulse 97   Temp 98.7 F (37.1 C)   Ht 5' 2 (1.575 m)   Wt 178 lb (80.7 kg)   SpO2 96%   BMI 32.56 kg/m   BP Readings from Last 3 Encounters:  10/29/23 118/75  08/26/23 117/73  07/11/23 120/80    Wt Readings from Last 3 Encounters:  10/29/23 178 lb (80.7 kg)  08/26/23 179 lb (81.2 kg)  07/11/23 178 lb (80.7 kg)     Physical Exam Constitutional:      General: She is not in acute distress.    Appearance: She is  well-developed.  HENT:     Head: Normocephalic and atraumatic.  Eyes:     Conjunctiva/sclera: Conjunctivae normal.     Pupils: Pupils are equal, round, and reactive to light.  Neck:     Thyroid : No thyromegaly.  Cardiovascular:     Rate and Rhythm: Normal rate and regular rhythm.     Heart sounds: Normal heart sounds. No murmur heard. Pulmonary:     Effort: Pulmonary effort is normal. No respiratory distress.     Breath sounds: Normal breath sounds. No wheezing or rales.  Abdominal:     General: Bowel sounds are normal. There is no distension.     Palpations: Abdomen is soft.     Tenderness: There is no abdominal tenderness.  Musculoskeletal:        General: Normal range of motion.     Cervical back: Normal range of motion and neck supple.  Lymphadenopathy:     Cervical: No cervical adenopathy.  Skin:    General: Skin is warm and dry.  Neurological:     Mental Status: She is alert and oriented to person, place, and time.  Psychiatric:        Behavior: Behavior normal.        Thought Content: Thought content normal.        Judgment: Judgment normal.      Assessment & Plan:  There are no diagnoses linked to this encounter.  Assessment and Plan Assessment & Plan Osteoarthritis of the hands   Chronic pain and soreness in her fingers are likely due to osteoarthritis, exacerbated by frequent hand use at work. She prefers medication only when pain is severe. Recommend Tylenol  Extra Strength (500 mg), two tablets three times a day when pain worsens, to minimize liver risk. Discussed the safety of Tylenol  and the importance of not exceeding recommended doses to avoid liver damage.  Elevated alkaline phosphatase   Her alkaline phosphatase level is elevated at 175, likely related to arthritis changes rather than liver or bone pathology. Other liver function tests are normal, indicating no significant liver disease. Plan to monitor alkaline phosphatase levels. Discussed the  relationship between alkaline phosphatase and arthritis, reassuring her it is not indicative of bone density issues or significant liver disease. Offered further testing such as liver ultrasound or bone scan if desired, but noted these are not necessary based on current findings.  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 It has not changed in size and is not causing any issues.  Benign skin nevi   She has multiple small red dots on her skin, likely hereditary and harmless. No concerning moles or skin changes observed. Advised to monitor for any changes in skin lesions and report if any changes occur.  Recording duration:  26 minutes       Follow-up: Return in about 6 months (around 04/27/2024) for Hypothyroidism, hypertension.  Butler Der, M.D.

## 2023-11-04 ENCOUNTER — Ambulatory Visit: Payer: Self-pay | Admitting: Family Medicine

## 2023-12-03 ENCOUNTER — Encounter: Payer: Self-pay | Admitting: Family Medicine

## 2023-12-03 ENCOUNTER — Ambulatory Visit: Admitting: Family Medicine

## 2023-12-03 ENCOUNTER — Ambulatory Visit: Payer: Self-pay

## 2023-12-03 VITALS — BP 124/77 | HR 92 | Temp 98.2°F | Ht 62.0 in | Wt 178.0 lb

## 2023-12-03 DIAGNOSIS — J0100 Acute maxillary sinusitis, unspecified: Secondary | ICD-10-CM | POA: Diagnosis not present

## 2023-12-03 DIAGNOSIS — J45901 Unspecified asthma with (acute) exacerbation: Secondary | ICD-10-CM | POA: Diagnosis not present

## 2023-12-03 MED ORDER — PROMETHAZINE-DM 6.25-15 MG/5ML PO SYRP: 5.0000 mL | ORAL_SOLUTION | Freq: Four times a day (QID) | ORAL | 0 refills | Status: AC | PRN

## 2023-12-03 MED ORDER — BETAMETHASONE SOD PHOS & ACET 6 (3-3) MG/ML IJ SUSP
6.0000 mg | Freq: Once | INTRAMUSCULAR | Status: AC
  Administered 2023-12-03: 6 mg via INTRAMUSCULAR

## 2023-12-03 MED ORDER — CLARITHROMYCIN 500 MG PO TABS: 500.0000 mg | ORAL_TABLET | Freq: Two times a day (BID) | ORAL | 0 refills | Status: DC

## 2023-12-03 NOTE — Progress Notes (Signed)
 Chief Complaint  Patient presents with   cough, congestion    X 5 days Hx: asthma    HPI  Patient presents today for Discussed the use of AI scribe software for clinical note transcription with the patient, who gave verbal consent to proceed.  History of Present Illness Jodi Rios is a 63 year old female with asthma who presents with a five-day history of cough and congestion.  She has experienced a five-day history of coughing that began on Friday, initially mild but worsening by Saturday. Last night, she woke up with significant congestion and was coughing up phlegm. She used her inhaler due to chest tightness and took a steamy shower, which provided some relief.  She reports that her asthma has been acting up with this illness, requiring use of her inhaler. Although she has a nebulizer, she opted to use her inhaler. She is uncertain about her medication refills.  No fever, but she reports chills. The phlegm is yellow-green. Sinus congestion causes a full feeling in her head and pressure around her ears, though no ear pain. She used Tylenol  Sinus yesterday but has not taken any medication today.     PMH: Smoking status noted Review of Systems  Constitutional:  Negative for activity change, appetite change, chills and fever.  HENT:  Positive for congestion, postnasal drip, rhinorrhea and sinus pressure. Negative for ear discharge, ear pain, hearing loss, nosebleeds, sneezing and trouble swallowing.   Respiratory:  Negative for chest tightness and shortness of breath.   Cardiovascular:  Negative for chest pain and palpitations.  Skin:  Negative for rash.    Objective: BP 124/77   Pulse 92   Temp 98.2 F (36.8 C)   Ht 5' 2 (1.575 m)   Wt 178 lb (80.7 kg)   SpO2 95%   BMI 32.56 kg/m  Gen: NAD, alert, cooperative with exam HEENT: NCAT, EOMI, PERRL CV: RRR, good S1/S2, no murmur Resp: CTABL, no wheezes, non-labored Ext: No edema, warm Neuro: Alert and oriented,  No gross deficits  Acute maxillary sinusitis, recurrence not specified -     Betamethasone Sod Phos & Acet  Moderate asthma with exacerbation, unspecified whether persistent  Other orders -     Promethazine -DM; Take 5 mLs by mouth 4 (four) times daily as needed for cough.  Dispense: 240 mL; Refill: 0 -     Clarithromycin; Take 1 tablet (500 mg total) by mouth 2 (two) times daily.  Dispense: 20 tablet; Refill: 0    Assessment and Plan Assessment & Plan Acute upper respiratory infection with cough and sinus congestion   She has an acute upper respiratory infection with cough and sinus congestion for five days, presenting with yellow-green phlegm, nasal congestion, and a scratchy throat. Chills are present, but no fever. Nasal passages are inflamed and swollen, especially on the right side, with no lymphadenopathy. Administer a cortisone shot to reduce inflammation and swelling in the sinuses and lungs. Prescribe clarithromycin for the infection, considering her allergies to penicillin, sulfa, cocaine, and floxacin. Prescribe cough medicine and send prescriptions to [facility].  Asthma exacerbation   Her asthma exacerbation is triggered by the upper respiratory infection, causing chest tightness and shortness of breath, which are relieved by inhaler use and steam inhalation. She has a nebulizer but has not used it. Ensure nebulizer medication is available at the pharmacy.      Butler Der, MD

## 2023-12-03 NOTE — Telephone Encounter (Signed)
 Pt has appt

## 2023-12-03 NOTE — Telephone Encounter (Signed)
 FYI Only or Action Required?: Action required by provider: request for appointment.  Patient was last seen in primary care on 10/29/2023 by Jodi Lowers, MD.  Called Nurse Triage reporting Cough.  Symptoms began several days ago.  Interventions attempted: OTC medications: tylenol  cold and sinus.  Symptoms are: gradually worsening.  Triage Disposition: See HCP Within 4 Hours (Or PCP Triage)  Patient/caregiver understands and will follow disposition?: YesCopied from CRM #[id number]. Topic: Clinical - Red Word Triage >> Dec 03, 2023  8:02 AM Willma SAUNDERS wrote: [facility] that prompted transfer to Nurse Triage: Patient states she has a cold which is affecting her asthma. Has a scratchy throat, congestion, cough and tightness in her chest and upper back. Reason for Disposition  [1] MILD difficulty breathing (e.g., minimal/no SOB at rest, SOB with walking, pulse < 100) AND [2] still present when not coughing  Answer Assessment - Initial Assessment Questions Pt has had to use inhaler during coughing attacks. Pt has tried sinus congestion medication.     1. ONSET: When did the cough begin?      Friday  2. SEVERITY: How bad is the cough today?     Coughing fits at times 3. SPUTUM: Describe the color of your sputum (e.g., none, dry cough; clear, white, yellow, green)     green  coughing up any blood? If Yes, ask: How much? (e.g., flecks, streaks, tablespoons, etc.)     denies 5. DIFFICULTY BREATHING: Are you having difficulty breathing? If Yes, ask: How bad is it? (e.g., mild, moderate, severe)      When coughing lots 6. FEVER: Do you have a fever? If Yes, ask: What is your temperature, how was it measured, and when did it start?     fever 7. CARDIAC HISTORY: Do you have any history of heart disease? (e.g., heart attack, congestive heart failure)      denies 8. LUNG HISTORY: Do you have any history of lung disease?  (e.g., pulmonary embolus, asthma, emphysema)     Asthma   9. PE RISK FACTORS: Do you have a history of blood clots? (or: recent major surgery, recent prolonged travel, bedridden)     na 10. OTHER SYMPTOMS: Do you have any other symptoms? (e.g., runny nose, wheezing, chest pain)       Chest congestion, wheezing  Protocols used: Cough - Acute Productive-A-AH

## 2023-12-05 ENCOUNTER — Ambulatory Visit: Payer: Self-pay

## 2023-12-05 ENCOUNTER — Ambulatory Visit

## 2023-12-05 NOTE — Telephone Encounter (Signed)
 Patient saw Dr Zollie on 10/7 and was given antibiotic. Now reports allergic reaction. Wants to know if Dr Zollie can send in alternative antibiotic?

## 2023-12-05 NOTE — Telephone Encounter (Signed)
 FYI Only or Action Required?: FYI only for provider.  Patient was last seen in primary care on 12/03/2023 by Zollie Lowers, MD.  Called Nurse Triage reporting Medication Reaction.  Symptoms began yesterday.  Interventions attempted: OTC medications: claritin with improvement.  Symptoms are: stable.  Triage Disposition: See Physician Within 24 Hours  Patient/caregiver understands and will follow disposition?: Yes          Copied from CRM #[id number]. Topic: Clinical - Red Word Triage >> Dec 05, 2023 11:10 AM Wess RAMAN wrote: Red Word that prompted transfer to Nurse Triage: Patient states she is allergic to the antibiotic, clarithromycin (BIAXIN) 500 MG tablet. It looks like she has a sun tan. No itching. She would a different medication called in   Pharmacy: [id number] [place] [place] [address] [place] [address] Phone: [phone] Fax: [phone] Hours: Not open 24 hours Reason for Disposition  Taking new prescription antibiotic  (Exception: Finished taking new prescription antibiotic.)  Answer Assessment - Initial Assessment Questions 1. APPEARANCE of RASH: What does the rash look like? (e.g., spots, blisters, raised areas, skin peeling, scaly)     Red all over 2. SIZE: How big are the spots? (e.g., tip of pen, eraser, coin; inches, centimeters)     *No Answer* 3. LOCATION: Where is the rash located?     generalized 4. COLOR: What color is the rash? (Note: It is difficult to assess rash color in people with darker-colored skin. When this situation occurs, simply ask the caller to describe what they see.)     red 5. ONSET: When did the rash begin?     Yesterday night 6. FEVER: Do you have a fever? If Yes, ask: What is your temperature, how was it measured, and when did it start?     denies 7. ITCHING: Does the rash itch? If Yes, ask: How bad is the itch? (Scale 1-10; or mild, moderate, severe)      denies 8. CAUSE: What do you think is causing the rash?     Medication reaction - endorses allergic to a lot of meds 9. NEW MEDICINES: What new medicines are you taking? (e.g., name of antibiotic) When did you start taking this medication?.     clarithromycin (BIAXIN) 500 MG tablet 10. OTHER SYMPTOMS: Do you have any other symptoms? (e.g., sore throat, fever, joint pain)       Endorses taking claritin this AM, with improvement 11. PREGNANCY: Is there any chance you are pregnant? When was your last menstrual period?       N/a  Protocols used: Rash - Widespread On Drugs-A-AH

## 2023-12-06 ENCOUNTER — Ambulatory Visit: Admitting: Family Medicine

## 2023-12-06 ENCOUNTER — Encounter: Payer: Self-pay | Admitting: Family Medicine

## 2023-12-06 ENCOUNTER — Other Ambulatory Visit: Payer: Self-pay | Admitting: Family Medicine

## 2023-12-06 VITALS — BP 141/88 | HR 84 | Temp 97.9°F | Ht 62.0 in | Wt 176.6 lb

## 2023-12-06 DIAGNOSIS — T7840XA Allergy, unspecified, initial encounter: Secondary | ICD-10-CM

## 2023-12-06 DIAGNOSIS — J0100 Acute maxillary sinusitis, unspecified: Secondary | ICD-10-CM | POA: Diagnosis not present

## 2023-12-06 DIAGNOSIS — Z888 Allergy status to other drugs, medicaments and biological substances status: Secondary | ICD-10-CM

## 2023-12-06 MED ORDER — EPINEPHRINE 0.3 MG/0.3ML IJ SOAJ: 0.3000 mg | INTRAMUSCULAR | 0 refills | Status: AC | PRN

## 2023-12-06 MED ORDER — METHYLPREDNISOLONE ACETATE 80 MG/ML IJ SUSP
80.0000 mg | Freq: Once | INTRAMUSCULAR | Status: AC
  Administered 2023-12-06: 40 mg via INTRAMUSCULAR

## 2023-12-06 MED ORDER — DOXYCYCLINE HYCLATE 100 MG PO CAPS: 100.0000 mg | ORAL_CAPSULE | Freq: Two times a day (BID) | ORAL | 0 refills | Status: AC

## 2023-12-06 MED ORDER — FAMOTIDINE 20 MG PO TABS: 20.0000 mg | ORAL_TABLET | Freq: Two times a day (BID) | ORAL | 0 refills | Status: AC

## 2023-12-06 MED ORDER — LORATADINE 10 MG PO TABS: 10.0000 mg | ORAL_TABLET | Freq: Every day | ORAL | 11 refills | Status: AC

## 2023-12-06 NOTE — Telephone Encounter (Signed)
 Please let the patient know that I sent their prescription to their pharmacy. Thanks, WS

## 2023-12-06 NOTE — Telephone Encounter (Signed)
 Called and spoke with patient and made her aware

## 2023-12-06 NOTE — Progress Notes (Signed)
 Subjective:  Patient ID: Jodi Rios, female    DOB: [birth year], 63 y.o.   MRN: [id number]  Patient Care Team: Zollie Lowers, MD as PCP - General (Family Medicine)   Chief Complaint:  Rash (Patient states that she has a rash all over but it has improved.  Noticed it yesterday after having allergic reaction to. )   HPI: Jodi Rios is a 63 y.o. female presenting on 12/06/2023 for Rash (Patient states that she has a rash all over but it has improved.  Noticed it yesterday after having allergic reaction to. )    Jodi Rios is a 63 year old female who presents with an allergic reaction to antibiotics.  She experienced an allergic reaction after taking clarithromycin for a sinus infection. The reaction manifested as a rash resembling sunburn, with redness and welts covering her body from head to toe. The rash appeared the morning after taking the medication. She also experienced generalized weakness, dizziness, and itching that began the night after the rash appeared.  She has been managing her symptoms with Claritin, which she took yesterday morning, last night, and this morning. She has been on Zyrtec  for a long time. She did not have Benadryl at home, so she used Claritin instead. She has not yet started the new antibiotic, doxycycline , prescribed for her sinus infection due to pharmacy availability issues.  She only took the clarithromycin once, the night before the rash appeared, after obtaining it on Wednesday when it was finally available at the pharmacy.    Relevant past medical, surgical, family, and social history reviewed and updated as indicated.  Allergies and medications reviewed and updated. Data reviewed: Chart in Epic.   Past Medical History:  Diagnosis Date   Anemia    GERD (gastroesophageal reflux disease)    Mitral valve prolapse 1990   Thyroid  disease     Past Surgical History:  Procedure Laterality Date   CESAREAN SECTION      CHOLECYSTECTOMY      Social History   Socioeconomic History   Marital status: Divorced    Spouse name: Not on file   Number of children: 2   Years of education: Not on file   Highest education level: Not on file  Occupational History   Occupation: AR15 Midwife: [employer]    Comment: 2nd shift  Tobacco Use   Smoking status: Never   Smokeless tobacco: Current  Vaping Use   Vaping status: Never Used  Substance and Sexual Activity   Alcohol use: Not Currently   Drug use: Never   Sexual activity: Not Currently  Other Topics Concern   Not on file  Social History Narrative   Patient is divorced.  She has 2 sons.  One son resides with her.  She has 3 grandchildren and 2 step grandchildren.   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   Social Drivers of Corporate investment banker Strain: Not on file  Food Insecurity: No Food Insecurity (10/29/2023)   Hunger Vital Sign    Worried About Running Out of Food in the Last Year: Never true    Ran Out of Food in the Last Year: Never true  Transportation Needs: No Transportation Needs (10/29/2023)   PRAPARE - Administrator, Civil Service (Medical): No    Lack of Transportation (Non-Medical): No  Physical Activity: Not on file  Stress: Not on file  Social Connections: Unknown (07/08/2021)   [facility]   Social Network    Social Network: Not on file  Intimate Partner Violence: Not At Risk (10/29/2023)   Humiliation, Afraid, Rape, and Kick questionnaire    Fear of Current or Ex-Partner: No    Emotionally Abused: No    Physically Abused: No    Sexually Abused: No    Outpatient Encounter Medications as of 12/06/2023  Medication Sig   albuterol  (PROVENTIL ) (2.5 MG/3ML) 0.083% nebulizer solution 2.5 mg every four (4) hours as needed.   albuterol  (VENTOLIN  HFA) 108 (90 Base) MCG/ACT inhaler Inhale 1-2 puffs into the lungs every 6 (six) hours as needed for wheezing or  shortness of breath.   ascorbic Acid (VITAMIN C) 500 MG CPCR Take by mouth.   Chlorphen-PE-Acetaminophen  4-10-325 MG TABS Take 1 tablet by mouth every 6 (six) hours as needed.   Cholecalciferol 25 MCG (1000 UT) tablet Take by mouth.   EPINEPHrine 0.3 mg/0.3 mL IJ SOAJ injection Inject 0.3 mg into the muscle as needed for anaphylaxis.   famotidine (PEPCID) 20 MG tablet Take 1 tablet (20 mg total) by mouth 2 (two) times daily for 14 days.   levothyroxine  (SYNTHROID ) 50 MCG tablet Take 1 tablet (50 mcg total) by mouth daily. Must take on an empty stomach.   loratadine (CLARITIN) 10 MG tablet Take 1 tablet (10 mg total) by mouth daily.   Multiple Vitamin (MULTI-VITAMIN) tablet Take by mouth.   omeprazole  (PRILOSEC) 40 MG capsule Take 1 capsule (40 mg total) by mouth daily. Must take on an empty stomach   Pseudoephedrine -Guaifenesin  [phone] MG TB12 Take 1 tablet by mouth 2 (two) times daily. For congestion   verapamil  (CALAN ) 80 MG tablet Take 1 tablet (80 mg total) by mouth daily.   [DISCONTINUED] cetirizine  (ZYRTEC ) 10 MG tablet Take 1 tablet (10 mg total) by mouth daily.   doxycycline  (VIBRAMYCIN ) 100 MG capsule Take 1 capsule (100 mg total) by mouth 2 (two) times daily. (Patient not taking: Reported on 12/06/2023)   promethazine -dextromethorphan (PROMETHAZINE -DM) 6.25-15 MG/5ML syrup Take 5 mLs by mouth 4 (four) times daily as needed for cough. (Patient not taking: Reported on 12/06/2023)   [DISCONTINUED] doxycycline  (VIBRAMYCIN ) 100 MG capsule Take 1 capsule (100 mg total) by mouth 2 (two) times daily.   No facility-administered encounter medications on file as of 12/06/2023.    Allergies  Allergen Reactions   Clarithromycin Hives   Codeine Other (See Comments)   Nitrofurantoin Other (See Comments)   Ofloxacin Other (See Comments)   Penicillins Other (See Comments)   Sulfamethoxazole Other (See Comments)   Moxifloxacin      Made her feel bad    Pertinent ROS per HPI, otherwise  unremarkable      Objective:  BP (!) 141/88   Pulse 84   Temp 97.9 F (36.6 C)   Ht 5' 2 (1.575 m)   Wt 176 lb 9.6 oz (80.1 kg)   SpO2 94%   BMI 32.30 kg/m    Wt Readings from Last 3 Encounters:  12/06/23 176 lb 9.6 oz (80.1 kg)  12/03/23 178 lb (80.7 kg)  10/29/23 178 lb (80.7 kg)    Physical Exam Vitals and nursing note reviewed.  Constitutional:      General: She is not in acute distress.    Appearance: Normal appearance. She is not ill-appearing, toxic-appearing or diaphoretic.  HENT:     Head: Normocephalic and atraumatic.     Nose: Nose normal.  Mouth/Throat:     Lips: Pink.     Mouth: Mucous membranes are moist. No angioedema.  Eyes:     Conjunctiva/sclera: Conjunctivae normal.     Pupils: Pupils are equal, round, and reactive to light.  Cardiovascular:     Rate and Rhythm: Normal rate and regular rhythm.     Heart sounds: Normal heart sounds.  Pulmonary:     Effort: Pulmonary effort is normal.     Breath sounds: Normal breath sounds. No stridor. No wheezing or rhonchi.  Musculoskeletal:     Cervical back: Neck supple.     Right lower leg: No edema.     Left lower leg: No edema.  Skin:    General: Skin is warm and dry.     Capillary Refill: Capillary refill takes less than 2 seconds.     Comments: Slight erythematous rash to forehead and lateral neck. No swelling or welts.   Neurological:     General: No focal deficit present.     Mental Status: She is alert and oriented to person, place, and time.  Psychiatric:        Mood and Affect: Mood normal.        Behavior: Behavior normal.        Thought Content: Thought content normal.        Judgment: Judgment normal.      Results for orders placed or performed in visit on 10/25/23  Thyroid  Panel With TSH   Collection Time: 10/25/23  8:45 AM  Result Value Ref Range   TSH 2.130 0.450 - 4.500 uIU/mL   T4, Total 10.8 4.5 - 12.0 ug/dL   T3 Uptake Ratio 25 24 - 39 %   Free Thyroxine Index 2.7 1.2 -  4.9  CBC with Differential/Platelet   Collection Time: 10/25/23  8:45 AM  Result Value Ref Range   WBC 6.4 3.4 - 10.8 x10E3/uL   RBC 5.16 3.77 - 5.28 x10E6/uL   Hemoglobin 15.2 11.1 - 15.9 g/dL   Hematocrit 53.2 (H) 65.9 - 46.6 %   MCV 91 79 - 97 fL   MCH 29.5 26.6 - 33.0 pg   MCHC 32.5 31.5 - 35.7 g/dL   RDW 87.6 88.2 - 84.5 %   Platelets 299 150 - 450 x10E3/uL   Neutrophils 58 Not Estab. %   Lymphs 24 Not Estab. %   Monocytes 12 Not Estab. %   Eos 4 Not Estab. %   Basos 1 Not Estab. %   Neutrophils Absolute 3.8 1.4 - 7.0 x10E3/uL   Lymphocytes Absolute 1.5 0.7 - 3.1 x10E3/uL   Monocytes Absolute 0.8 0.1 - 0.9 x10E3/uL   EOS (ABSOLUTE) 0.2 0.0 - 0.4 x10E3/uL   Basophils Absolute 0.1 0.0 - 0.2 x10E3/uL   Immature Granulocytes 0 Not Estab. %   Immature Grans (Abs) 0.0 0.0 - 0.1 x10E3/uL  CMP14+EGFR   Collection Time: 10/25/23  8:45 AM  Result Value Ref Range   Glucose 99 70 - 99 mg/dL   BUN 15 8 - 27 mg/dL   Creatinine, Ser 9.34 0.57 - 1.00 mg/dL   eGFR 99 >40 fO/fpw/8.26   BUN/Creatinine Ratio 23 12 - 28   Sodium 142 134 - 144 mmol/L   Potassium 4.2 3.5 - 5.2 mmol/L   Chloride 105 96 - 106 mmol/L   CO2 24 20 - 29 mmol/L   Calcium 9.2 8.7 - 10.3 mg/dL   Total Protein 6.1 6.0 - 8.5 g/dL   Albumin 4.0 3.9 - 4.9  g/dL   Globulin, Total 2.1 1.5 - 4.5 g/dL   Bilirubin Total 0.7 0.0 - 1.2 mg/dL   Alkaline Phosphatase 175 (H) 44 - 121 IU/L   AST 23 0 - 40 IU/L   ALT 24 0 - 32 IU/L  Lipid panel   Collection Time: 10/25/23  8:45 AM  Result Value Ref Range   Cholesterol, Total 136 100 - 199 mg/dL   Triglycerides 57 0 - 149 mg/dL   HDL 51 >60 mg/dL   VLDL Cholesterol Cal 12 5 - 40 mg/dL   LDL Chol Calc (NIH) 73 0 - 99 mg/dL   Chol/HDL Ratio 2.7 0.0 - 4.4 ratio       Pertinent labs & imaging results that were available during my care of the patient were reviewed by me and considered in my medical decision making.  Assessment & Plan:  Jodi Rios was seen today for  rash.  Diagnoses and all orders for this visit:  Allergic reaction, initial encounter -     famotidine (PEPCID) 20 MG tablet; Take 1 tablet (20 mg total) by mouth 2 (two) times daily for 14 days. -     EPINEPHrine 0.3 mg/0.3 mL IJ SOAJ injection; Inject 0.3 mg into the muscle as needed for anaphylaxis. -     loratadine (CLARITIN) 10 MG tablet; Take 1 tablet (10 mg total) by mouth daily.  Acute non-recurrent maxillary sinusitis Take medications prescribed by Dr. Zollie       Allergic reaction to clarithromycin Experienced a systemic allergic reaction to clarithromycin after a single dose, with symptoms including rash resembling sunburn, itching, dizziness, weakness, and generalized discomfort. There is a potential for anaphylaxis due to a history of multiple allergies. - Add clarithromycin to allergy list - Prescribe EpiPen for emergency use in case of anaphylaxis - Provide information on signs and symptoms of anaphylaxis - Administer steroid injection to manage current allergic symptoms - Prescribe famotidine (Pepcid) for two weeks to manage histamine response - Advise continuation of Zyrtec  or Claritin daily - Instruct to use Benadryl as needed for significant itching - Advise to go to the hospital if symptoms worsen or if EpiPen is used  Acute sinus infection Acute sinus infection with symptoms of congestion and exacerbation of asthma. Initial antibiotic, clarithromycin, caused an allergic reaction. Doxycycline  prescribed as an alternative but not yet started due to pharmacy availability issues. - Start doxycycline  for sinus infection as prescribed by Dr. Zollie       Continue all other maintenance medications.  Follow up plan: Return if symptoms worsen or fail to improve.   Continue healthy lifestyle choices, including diet (rich in fruits, vegetables, and lean proteins, and low in salt and simple carbohydrates) and exercise (at least 30 minutes of moderate physical activity  daily).  Educational handout given for anaphylactic reaction  The above assessment and management plan was discussed with the patient. The patient verbalized understanding of and has agreed to the management plan. Patient is aware to call the clinic if they develop any new symptoms or if symptoms persist or worsen. Patient is aware when to return to the clinic for a follow-up visit. Patient educated on when it is appropriate to go to the emergency department.   Rosaline Bruns, FNP-C [facility] [phone]

## 2024-01-03 DIAGNOSIS — Z1231 Encounter for screening mammogram for malignant neoplasm of breast: Secondary | ICD-10-CM | POA: Diagnosis not present

## 2024-01-03 DIAGNOSIS — Z01419 Encounter for gynecological examination (general) (routine) without abnormal findings: Secondary | ICD-10-CM | POA: Diagnosis not present

## 2024-01-03 DIAGNOSIS — Z124 Encounter for screening for malignant neoplasm of cervix: Secondary | ICD-10-CM | POA: Diagnosis not present

## 2024-01-03 LAB — HM MAMMOGRAPHY

## 2024-01-09 LAB — HM PAP SMEAR

## 2024-01-12 ENCOUNTER — Ambulatory Visit: Payer: Self-pay | Admitting: Family Medicine

## 2024-01-12 NOTE — Progress Notes (Signed)
 Your recent Pap smear performed in the office came back normal.

## 2024-01-23 DIAGNOSIS — J0100 Acute maxillary sinusitis, unspecified: Secondary | ICD-10-CM | POA: Diagnosis not present

## 2024-02-08 ENCOUNTER — Other Ambulatory Visit: Payer: Self-pay | Admitting: Family Medicine

## 2024-04-30 ENCOUNTER — Ambulatory Visit: Payer: Self-pay | Admitting: Family Medicine
